# Patient Record
Sex: Female | Born: 1965 | Race: White | Hispanic: No | Marital: Married | Smoking: Never smoker
Health system: Southern US, Community
[De-identification: ages and names within clinical notes are randomized; demographics above are authoritative.]

## PROBLEM LIST (undated history)

## (undated) DIAGNOSIS — J309 Allergic rhinitis, unspecified: Secondary | ICD-10-CM

## (undated) DIAGNOSIS — E559 Vitamin D deficiency, unspecified: Secondary | ICD-10-CM

## (undated) DIAGNOSIS — M8580 Other specified disorders of bone density and structure, unspecified site: Secondary | ICD-10-CM

## (undated) HISTORY — DX: Vitamin D deficiency, unspecified: E55.9

## (undated) HISTORY — DX: Other specified disorders of bone density and structure, unspecified site: M85.80

## (undated) HISTORY — DX: Allergic rhinitis, unspecified: J30.9

---

## 2015-03-17 ENCOUNTER — Encounter: Payer: Self-pay | Admitting: Sports Medicine

## 2015-03-17 ENCOUNTER — Ambulatory Visit (INDEPENDENT_AMBULATORY_CARE_PROVIDER_SITE_OTHER): Payer: BLUE CROSS/BLUE SHIELD | Admitting: Sports Medicine

## 2015-03-17 ENCOUNTER — Ambulatory Visit (INDEPENDENT_AMBULATORY_CARE_PROVIDER_SITE_OTHER): Payer: BLUE CROSS/BLUE SHIELD

## 2015-03-17 VITALS — BP 108/70 | HR 90 | Ht 65.0 in | Wt 189.0 lb

## 2015-03-17 DIAGNOSIS — Z1389 Encounter for screening for other disorder: Secondary | ICD-10-CM

## 2015-03-17 DIAGNOSIS — M5412 Radiculopathy, cervical region: Secondary | ICD-10-CM

## 2015-03-17 DIAGNOSIS — M8580 Other specified disorders of bone density and structure, unspecified site: Secondary | ICD-10-CM

## 2015-03-17 DIAGNOSIS — M1712 Unilateral primary osteoarthritis, left knee: Secondary | ICD-10-CM | POA: Insufficient documentation

## 2015-03-17 DIAGNOSIS — M4802 Spinal stenosis, cervical region: Secondary | ICD-10-CM | POA: Diagnosis not present

## 2015-03-17 DIAGNOSIS — M8588 Other specified disorders of bone density and structure, other site: Secondary | ICD-10-CM

## 2015-03-17 DIAGNOSIS — M85862 Other specified disorders of bone density and structure, left lower leg: Secondary | ICD-10-CM

## 2015-03-17 DIAGNOSIS — M25562 Pain in left knee: Secondary | ICD-10-CM

## 2015-03-17 HISTORY — DX: Other specified disorders of bone density and structure, unspecified site: M85.80

## 2015-03-17 MED ORDER — MELOXICAM 15 MG PO TABS: ORAL_TABLET | ORAL | Status: DC

## 2015-03-17 NOTE — Assessment & Plan Note (Signed)
I am going to go ahead and order a bone density test.

## 2015-03-17 NOTE — Progress Notes (Signed)
   Subjective:    I'm seeing this patient as a consultation for:  Ms. Jomarie LongsJoseph, NP  CC:  Left knee pain  HPI: For several weeks this pleasant 49 year old female has had increasing pain that she localizes just distal to the medial joint line without mechanical symptoms, only minimal swelling. Mild joint line pain. Symptoms are moderate, persistent.   She also has been seeing a chiropractor for neck pain with radiation to the right hand in a C8 distribution. Symptoms are moderate, persistent. Has only had a moderate response thus far with chiropractic care.  Past medical history, Surgical history, Family history not pertinant except as noted below, Social history, Allergies, and medications have been entered into the medical record, reviewed, and no changes needed.   Review of Systems: No headache, visual changes, nausea, vomiting, diarrhea, constipation, dizziness, abdominal pain, skin rash, fevers, chills, night sweats, weight loss, swollen lymph nodes, body aches, joint swelling, muscle aches, chest pain, shortness of breath, mood changes, visual or auditory hallucinations.   Objective:   General: Well Developed, well nourished, and in no acute distress.  Neuro/Psych: Alert and oriented x3, extra-ocular muscles intact, able to move all 4 extremities, sensation grossly intact. Skin: Warm and dry, no rashes noted.  Respiratory: Not using accessory muscles, speaking in full sentences, trachea midline.  Cardiovascular: Pulses palpable, no extremity edema. Abdomen: Does not appear distended. Left Knee: Minimally tender to palpation at the medial joint line and over the pes anserine bursa. ROM normal in flexion and extension and lower leg rotation. Ligaments with solid consistent endpoints including ACL, PCL, LCL, MCL. Negative Mcmurray's and provocative meniscal tests. Non painful patellar compression. Patellar and quadriceps tendons unremarkable. Hamstring and quadriceps strength is  normal.  X-ray show multilevel degenerative change.   Knee x-rays do show arthritis in all 3 compartments.  Impression and Recommendations:   This case required medical decision making of moderate complexity.

## 2015-03-17 NOTE — Assessment & Plan Note (Signed)
Right C8, has gone through months of chiropractic treatment. We are going to get some x-rays but I will not actively treat this until she is plateaued with chiropractic care.

## 2015-03-17 NOTE — Assessment & Plan Note (Signed)
Most likely early osteoarthritis versus pes anserine bursitis. Formal physical therapy, meloxicam, x-rays.  Return in one month, further interventional treatment will depend on imaging results.

## 2015-03-18 ENCOUNTER — Encounter: Payer: Self-pay | Admitting: Physical Therapy

## 2015-03-18 ENCOUNTER — Ambulatory Visit (INDEPENDENT_AMBULATORY_CARE_PROVIDER_SITE_OTHER): Payer: BLUE CROSS/BLUE SHIELD | Admitting: Physical Therapy

## 2015-03-18 DIAGNOSIS — M6281 Muscle weakness (generalized): Secondary | ICD-10-CM

## 2015-03-18 DIAGNOSIS — M25562 Pain in left knee: Secondary | ICD-10-CM

## 2015-03-18 NOTE — Patient Instructions (Signed)
Straight Leg Raise: With External Leg Rotation   Lie on back with right leg straight, opposite leg bent. Rotate straight leg out and lift __6-8__ inches. Repeat __15__ times per set. Do __3__ sets per session. Do _1__ sessions per day. Repeat on both legs  http://orth.exer.us/728   Strengthening: Hip Abduction (Side-Lying)   Tighten muscles on front of left thigh, then lift leg _8-10___ inches from surface, keeping knee locked.  Repeat _15_ times per set. Do __3__ sets per session. Do __1__ sessions per day. Repeat on both legs  http://orth.exer.us/622   Copyright  VHI. All rights reserved.  Quads / HF, Prone   Lie face down, knees together. Grasp left ankle with a towel if needed to reach. Gently pull foot toward buttock. Hold 30-45 seconds. Repeat _2__ times per session. Do __1_ sessions per day. [place] [phone]

## 2015-03-18 NOTE — Therapy (Signed)
[facility] [place] [address] [place], [place], [address] Phone: [phone]   Fax:  [phone]  Physical Therapy Treatment  Patient Details  Name: Heidi Fuller MRN: [id number] Date of Birth: 08-Jun-1966 Referring Provider:  Monica Becton,*  Encounter Date: 03/18/2015      PT End of Session - 03/18/15 1152    Visit Number 1   Number of Visits 4   Date for PT Re-Evaluation 05/13/15   PT Start Time 1152   PT Stop Time 1246   PT Time Calculation (min) 54 min   Activity Tolerance Patient tolerated treatment well      History reviewed. No pertinent past medical history.  History reviewed. No pertinent past surgical history.  There were no vitals filed for this visit.  Visit Diagnosis:  Pain in knee joint, left - Plan: PT plan of care cert/re-cert  Muscle weakness (generalized) - Plan: PT plan of care cert/re-cert      Subjective Assessment - 03/18/15 1159    Symptoms Patient reports she tried to start working out on a step and thinks she over did it and developed Lt knee pain .    Pertinent History going to have a bone scan on her neck    Diagnostic tests x-rays - degenerative changes   Patient Stated Goals wishes to start exercising,    Currently in Pain? Yes   Pain Score 2    Pain Location Knee   Pain Orientation Left   Pain Descriptors / Indicators Sharp;Aching;Sore   Pain Type Acute pain   Pain Frequency Intermittent   Aggravating Factors  intermittent, has been favoring her knee so she is not sure.  some days it doesn't hurt too bad.Will shoot up to 6/10   Pain Relieving Factors wearing a compression sleeve most days. Gustavus Bryant            [facility] PT Assessment - 03/18/15 0001    Assessment   Medical Diagnosis Lt knee pain   Onset Date 02/11/15   Next MD Visit 04/14/15   Precautions   Precautions None   Balance Screen   Has the patient fallen in the past 6 months Yes   How many times? 1  fell down  the stairs, not used to stairs at her new house   Has the patient had a decrease in activity level because of a fear of falling?  No   Is the patient reluctant to leave their home because of a fear of falling?  No   Prior Function   Level of Independence --  was I with all activities   Vocation Full time employment   Vocation Requirements desk job/stand up desk   Leisure likes to work out   Observation/Other Assessments   Focus on Therapeutic Outcomes (FOTO)  45% limited   Posture/Postural Control   Posture/Postural Control --  extra soft tissue around her knees.    ROM / Strength   AROM / PROM / Strength AROM;Strength   AROM   Overall AROM Comments LE's WNL however lateral tracking Lt patella   Strength   Overall Strength --  ankles/knees WNL   Overall Strength Comments --  fair eccentric Lt quad control, good on the Rt   Strength Assessment Site Hip   Right/Left Hip Right;Left   Right Hip Flexion 5/5   Right Hip Extension --  5-/5   Right Hip ABduction 4+/5   Left Hip Flexion 5/5   Left Hip Extension 4+/5  Left Hip ABduction 4/5   Flexibility   Soft Tissue Assessment /Muscle Length --  tight Lt quad prone    Balance   Balance Assessed --  single leg stance WNL bilat, however pain on Lt LE                   [facility] Adult PT Treatment/Exercise - 03/18/15 0001    Exercises   Exercises Knee/Hip   Knee/Hip Exercises: Stretches   Quad Stretch 30 seconds  prone with strap   Knee/Hip Exercises: Supine   Straight Leg Raise with External Rotation 3 sets;15 reps;Both   Knee/Hip Exercises: Sidelying   Hip ABduction Both;3 sets;15 reps                PT Education - 03/18/15 1230    Education provided Yes   Education Details HEP   Person(s) Educated Patient   Methods Explanation;Handout   Comprehension Verbalized understanding             PT Long Term Goals - 03/18/15 1233    PT LONG TERM GOAL #1   Title I with HEP   Time 8   Period Weeks    Status New   PT LONG TERM GOAL #2   Title demo increase strength bilat hip abduction and extension =/> 5-5   Time 8   Period Weeks   Status New   PT LONG TERM GOAL #3   Title improve FOTO =/< 35% limited   Time 8   Period Weeks   Status New   PT LONG TERM GOAL #4   Title demo good quad control with eccentric activities   Time 8   Period Weeks   Status New               Plan - 03/18/15 1251    Clinical Impression Statement Pt presents with Lt knee pain, has weakness in her hips that are causing instability through the knee with weight bearing activities.  She should repsond well to Presenter, broadcastingstrengthening and body mechanic education   Pt will benefit from skilled therapeutic intervention in order to improve on the following deficits Pain;Decreased strength   Rehab Potential Excellent   PT Frequency --  everyother week   PT Duration 8 weeks   PT Treatment/Interventions Moist Heat;Patient/family education;DME Instruction;Therapeutic exercise;Ultrasound;Balance training;Gait training;Manual techniques;Cryotherapy;Stair training;Neuromuscular re-education;Electrical Stimulation   PT Next Visit Plan progress HEP    Consulted and Agree with Plan of Care Patient        Problem List Patient Active Problem List   Diagnosis Date Noted  . Left knee pain 03/17/2015  . Right cervical radiculopathy 03/17/2015  . Osteopenia determined by x-ray 03/17/2015    Roderic ScarceSusan Nethaniel Mattie PT 03/18/2015, 12:57 PM  [facility] [address] [place], [place], [address] Phone: [phone]   Fax:  [phone]

## 2015-03-25 ENCOUNTER — Telehealth: Payer: Self-pay | Admitting: Sports Medicine

## 2015-03-25 NOTE — Telephone Encounter (Signed)
Are we talking about for her knee or her neck?  It into the knee, she should return and I will inject it, it it's the neck, she simply needs more physical therapy, and I am happy to add some gabapentin to block her pain.

## 2015-03-25 NOTE — Telephone Encounter (Signed)
Patient called clinic to see what Dr. Benjamin Stainhekkekandam would prefer she take OTC along with her Meloxicam? States the meloxicam helps for the first 2 days, and now she is not getting good pain relief.

## 2015-03-28 ENCOUNTER — Encounter: Payer: Self-pay | Admitting: Sports Medicine

## 2015-03-28 ENCOUNTER — Ambulatory Visit (INDEPENDENT_AMBULATORY_CARE_PROVIDER_SITE_OTHER): Payer: BLUE CROSS/BLUE SHIELD | Admitting: Sports Medicine

## 2015-03-28 VITALS — BP 138/85 | HR 94 | Wt 189.0 lb

## 2015-03-28 DIAGNOSIS — M1712 Unilateral primary osteoarthritis, left knee: Secondary | ICD-10-CM | POA: Diagnosis not present

## 2015-03-28 NOTE — Assessment & Plan Note (Signed)
Unable to tolerate oral medications or therapy at this point, injection as above. Return at the end of the month to reevaluate. She does have some mechanical symptoms, and will likely need an MRI and arthroscopy if symptoms persist.

## 2015-03-28 NOTE — Telephone Encounter (Signed)
Attempted to contact Pt regarding recommendations,no answer. Left message and provided callback.

## 2015-03-28 NOTE — Telephone Encounter (Signed)
Spoke with pt's husband regarding her knee pain and she is actually on the schedule for today.

## 2015-03-28 NOTE — Progress Notes (Signed)
  Subjective:    CC: follow-up  HPI: Left knee pain: Has done some exercises with physical therapy however continues to have pain that she localizes in the posterior aspect of the joint, unfortunately she was not able to make it until her next follow-up visit, and has a recurrence of pain. Moderate, persistent. Localized in the posterior joint line with radiation into the upper calf. She does have a few mechanical symptoms, predominantly buckling.  Past medical history, Surgical history, Family history not pertinant except as noted below, Social history, Allergies, and medications have been entered into the medical record, reviewed, and no changes needed.   Review of Systems: No fevers, chills, night sweats, weight loss, chest pain, or shortness of breath.   Objective:    General: Well Developed, well nourished, and in no acute distress.  Neuro: Alert and oriented x3, extra-ocular muscles intact, sensation grossly intact.  HEENT: Normocephalic, atraumatic, pupils equal round reactive to light, neck supple, no masses, no lymphadenopathy, thyroid nonpalpable.  Skin: Warm and dry, no rashes. Cardiac: Regular rate and rhythm, no murmurs rubs or gallops, no lower extremity edema.  Respiratory: Clear to auscultation bilaterally. Not using accessory muscles, speaking in full sentences. Left Knee: Normal to inspection with no erythema or effusion or obvious bony abnormalities. Palpation normal with no warmth or joint line tenderness or patellar tenderness or condyle tenderness. ROM normal in flexion and extension and lower leg rotation. Ligaments with solid consistent endpoints including ACL, PCL, LCL, MCL. Negative Mcmurray's and provocative meniscal tests. Non painful patellar compression. Patellar and quadriceps tendons unremarkable. Hamstring and quadriceps strength is normal.  Procedure: Real-time Ultrasound Guided Injection of left knee Device: GE Logiq E  Verbal informed consent  obtained.  Time-out conducted.  Noted no overlying erythema, induration, or other signs of local infection.  Skin prepped in a sterile fashion.  Local anesthesia: Topical Ethyl chloride.  With sterile technique and under real time ultrasound guidance:  2 mL kenalog 40, 4 mL lidocaine injected easily. Completed without difficulty  Pain immediately resolved suggesting accurate placement of the medication.  Advised to call if fevers/chills, erythema, induration, drainage, or persistent bleeding.  Images permanently stored and available for review in the ultrasound unit.  Impression: Technically successful ultrasound guided injection.  Impression and Recommendations:

## 2015-03-30 ENCOUNTER — Ambulatory Visit (INDEPENDENT_AMBULATORY_CARE_PROVIDER_SITE_OTHER): Payer: BLUE CROSS/BLUE SHIELD

## 2015-03-30 ENCOUNTER — Other Ambulatory Visit: Payer: BLUE CROSS/BLUE SHIELD

## 2015-03-30 ENCOUNTER — Ambulatory Visit (INDEPENDENT_AMBULATORY_CARE_PROVIDER_SITE_OTHER): Payer: BLUE CROSS/BLUE SHIELD | Admitting: Physical Therapy

## 2015-03-30 DIAGNOSIS — Z13820 Encounter for screening for osteoporosis: Secondary | ICD-10-CM | POA: Diagnosis not present

## 2015-03-30 DIAGNOSIS — M25562 Pain in left knee: Secondary | ICD-10-CM

## 2015-03-30 DIAGNOSIS — M6281 Muscle weakness (generalized): Secondary | ICD-10-CM | POA: Diagnosis not present

## 2015-03-30 NOTE — Therapy (Signed)
[facility] [place] [address] [place], [place], [address] Phone: [phone]   Fax:  [phone]  Physical Therapy Treatment  Patient Details  Name: Heidi Fuller MRN: [id number] Date of Birth: Mar 07, 1966 Referring Provider:  Monica Becton,*  Encounter Date: 03/30/2015      PT End of Session - 03/30/15 0800    Visit Number 2   Number of Visits 4   Date for PT Re-Evaluation 05/13/15   PT Start Time 0801   PT Stop Time 0849   PT Time Calculation (min) 48 min   Activity Tolerance Patient tolerated treatment well   Behavior During Therapy [facility] for tasks assessed/performed      No past medical history on file.  No past surgical history on file.  There were no vitals filed for this visit.  Visit Diagnosis:  Pain in knee joint, left  Muscle weakness (generalized)      Subjective Assessment - 03/30/15 0802    Subjective Last wed left knee got really bad. Sunday something popped in knee. Dr T. gave her an injection Monday  to lateral knee and feels a little better. Couldn't straigten leg but now can since injection.   Limitations Standing   Currently in Pain? Yes   Pain Score 4    Pain Location Knee   Pain Orientation Left   Pain Descriptors / Indicators Aching;Sharp;Sore   Pain Type Acute pain   Pain Onset 1 to 4 weeks ago   Pain Frequency Intermittent   Aggravating Factors  unsure   Pain Relieving Factors heat   Effect of Pain on Daily Activities unable to walk normally            [facility] PT Assessment - 03/30/15 0001    Strength   Right Hip Extension --  5-/5   Right Hip ABduction 4+/5   Left Hip Extension 4+/5   Left Hip ABduction 4+/5                   [facility] Adult PT Treatment/Exercise - 03/30/15 0001    Knee/Hip Exercises: Stretches   Passive Hamstring Stretch 3 reps;30 seconds  with strap   ITB Stretch 3 reps;30 seconds  with straps   Gastroc Stretch 1 rep;30 seconds   Soleus  Stretch 1 rep;30 seconds   Knee/Hip Exercises: Standing   Lateral Step Up Limitations 8inch x 20   Forward Step Up Limitations 8inch x 20   Other Standing Knee Exercises calf raises 3 way x 10 each   Knee/Hip Exercises: Sidelying   Clams 1x10, then red tbanc 2x10   Manual Therapy   Manual Therapy Myofascial release   Myofascial Release Left ITB in sidelying and hamstrings and gastroc in prone                PT Education - 03/30/15 0858    Education provided Yes   Education Details HEP   Person(s) Educated Patient   Methods Explanation;Demonstration;Handout   Comprehension Verbalized understanding;Returned demonstration             PT Long Term Goals - 03/30/15 0903    PT LONG TERM GOAL #1   Title I with HEP   Time 8   Period Weeks   Status On-going   PT LONG TERM GOAL #2   Title demo increase strength bilat hip abduction and extension =/> 5-5   Time 8   Period Weeks   Status On-going   PT LONG TERM GOAL #3  Title improve FOTO =/< 35% limited   Time 8   Period Weeks   Status On-going   PT LONG TERM GOAL #4   Title demo good quad control with eccentric activities   Time 8   Period Weeks   Status On-going               Plan - 03/30/15 [id number]    Clinical Impression Statement Patient tolerated treatment well after flare up this past weekend and subsequent lidocaine injection. She is still favoring the left knee but able to straighten knee and fully WB. Pain is localized to posterior knee and distal ITB. Patient demos tightness in left soleus also. Patient is progressing with hip strengthening.   Pt will benefit from skilled therapeutic intervention in order to improve on the following deficits Pain;Decreased strength   Rehab Potential Excellent   PT Frequency Other (comment)  1 every 2wks   PT Treatment/Interventions Moist Heat;Patient/family education;DME Instruction;Therapeutic exercise;Ultrasound;Balance training;Gait training;Manual  techniques;Cryotherapy;Stair training;Neuromuscular re-education;Electrical Stimulation   PT Next Visit Plan progress HEP    Consulted and Agree with Plan of Care Patient        Problem List Patient Active Problem List   Diagnosis Date Noted  . Primary osteoarthritis of left knee 03/17/2015  . Right cervical radiculopathy 03/17/2015  . Osteopenia determined by x-ray 03/17/2015    Solon PalmJulie Marilla Boddy PT  03/30/2015, 9:05 AM  [facility] [address] [address] [place], [place], [address] Phone: [phone]   Fax:  [phone]

## 2015-03-30 NOTE — Patient Instructions (Signed)
.  Achilles / Soleus, Standing   Stand, right foot behind, heel on floor and turned slightly out. Lower hips and bend knees. Hold __30_ seconds. Repeat _3__ times per session. Do 2___ sessions per day.  Copyright  VHI. All rights reserved.   Achilles / Gastroc, Standing   Stand, right foot behind, heel on floor and turned slightly out, leg straight, forward leg bent. Move hips forward. Hold _30__ seconds. Repeat _3__ times per session. Do 2_ sessions per day.  Copyright  VHI. All rights reserved.  External Rotation: Hip - Knees Apart (Side-Lying)   Lie on left side with hips and knees slightly bent, band tied just above knees. Pull knees apart. Hold for __2_ seconds. Rest for ___ seconds. Repeat _10-30__ times. Do ___ times a day.   Copyright  VHI. All rights reserved.   Adductor Stretch: Reclined (Strap, Wall)   Warm up with leg vertical. Rotate leg to side and fix foot to wall. Anchor opposite hip. Hold for 30 seconds. Repeat 3__ times each leg.  Also, pull left leg across body to the right and hold 30 sec x 3.   Copyright  VHI. All rights reserved.    Solon PalmJulie Kaydyn Sayas, PT 03/30/2015 8:49 AM [facility] [phone]

## 2015-04-12 ENCOUNTER — Encounter: Payer: Self-pay | Admitting: Sports Medicine

## 2015-04-14 ENCOUNTER — Encounter: Payer: Self-pay | Admitting: Sports Medicine

## 2015-04-14 ENCOUNTER — Ambulatory Visit (INDEPENDENT_AMBULATORY_CARE_PROVIDER_SITE_OTHER): Payer: BLUE CROSS/BLUE SHIELD | Admitting: Sports Medicine

## 2015-04-14 ENCOUNTER — Ambulatory Visit (INDEPENDENT_AMBULATORY_CARE_PROVIDER_SITE_OTHER): Payer: BLUE CROSS/BLUE SHIELD | Admitting: Family Medicine

## 2015-04-14 ENCOUNTER — Encounter: Payer: Self-pay | Admitting: Family Medicine

## 2015-04-14 ENCOUNTER — Ambulatory Visit (INDEPENDENT_AMBULATORY_CARE_PROVIDER_SITE_OTHER): Payer: BLUE CROSS/BLUE SHIELD | Admitting: Physical Therapy

## 2015-04-14 VITALS — BP 107/73 | HR 84 | Ht 65.0 in | Wt 192.0 lb

## 2015-04-14 DIAGNOSIS — J302 Other seasonal allergic rhinitis: Secondary | ICD-10-CM

## 2015-04-14 DIAGNOSIS — E559 Vitamin D deficiency, unspecified: Secondary | ICD-10-CM

## 2015-04-14 DIAGNOSIS — M25562 Pain in left knee: Secondary | ICD-10-CM

## 2015-04-14 DIAGNOSIS — M1712 Unilateral primary osteoarthritis, left knee: Secondary | ICD-10-CM

## 2015-04-14 DIAGNOSIS — Z0000 Encounter for general adult medical examination without abnormal findings: Secondary | ICD-10-CM

## 2015-04-14 NOTE — Progress Notes (Addendum)
Subjective:    Patient ID: Heidi Fuller, female    DOB: 06/12/1966, 49 y.o.   MRN: [id number]  HPI She is her to estab care.      She plans on scheduling a CPE.  Soon. She would like to get her labs slip.    He has certain labs that she would like checked again. She has hx of vitamin D def. She has been taking a supplement for this OTC. She wants to know difference b/t D2 and D3.    Her prior PCP has been following her CRP intermittantly to help eval her cardiac risk. She would like ot have that done again.   AR- she uses allegra and flonase for this.    She is also being seen by Dr. Rodney Langton for [facility] arthritis of the left knee. In fact she is actually starting physical therapy today.  Review of Systems  Constitutional: Negative for fever, diaphoresis and unexpected weight change.  HENT: Negative for hearing loss, rhinorrhea and tinnitus.   Eyes: Negative for visual disturbance.  Respiratory: Negative for cough and wheezing.   Cardiovascular: Negative for chest pain and palpitations.  Gastrointestinal: Negative for nausea, vomiting, diarrhea and blood in stool.  Genitourinary: Negative for vaginal bleeding, vaginal discharge and difficulty urinating.  Musculoskeletal: Negative for myalgias and arthralgias.  Skin: Negative for rash.  Neurological: Negative for headaches.  Hematological: Negative for adenopathy. Does not bruise/bleed easily.  Psychiatric/Behavioral: Negative for sleep disturbance and dysphoric mood. The patient is not nervous/anxious.    BP 107/73 mmHg  Pulse 84  Ht  (1.651 m)  Wt 192 lb (87.091 kg)  BMI 31.95 kg/m2  LMP  (LMP Unknown)    No Known Allergies  History reviewed. No pertinent past medical history.  History reviewed. No pertinent past surgical history.  History   Social History  . Marital Status: Married    Spouse Name: Nysia Dell  . Number of Children: 5  . Years of Education: N/A   Occupational History  . customer  service     [employer]   Social History Main Topics  . Smoking status: Never Smoker   . Smokeless tobacco: Never Used  . Alcohol Use: 3.6 - 4.2 oz/week    6-7 Glasses of wine per week     Comment: 6-7/week  . Drug Use: No  . Sexual Activity:    Partners: Male    Birth Control/ Protection: None   Other Topics Concern  . Not on file   Social History Narrative   1-2 cups of caffeine per day, she works for [employer] as a Museum/gallery conservator. She does not exercise regularly. She completed Technical school.     Family History  Problem Relation Age of Onset  . Heart attack Father   . Hyperlipidemia Father   . Hypertension Father     Outpatient Encounter Prescriptions as of 04/14/2015  Medication Sig  . Cholecalciferol (D3 ADULT PO) Take by mouth.  Marland Kitchen CINNAMON PO Take by mouth.  . fexofenadine (ALLEGRA) 30 MG tablet Take 30 mg by mouth 2 (two) times daily as needed.  . fluticasone (FLONASE) 50 MCG/ACT nasal spray Place into both nostrils daily.  . meloxicam (MOBIC) 15 MG tablet One tab PO qAM with breakfast for 2 weeks, then daily prn pain.  . Multiple Vitamin (MULTIVITAMIN) capsule Take 1 capsule by mouth daily.  . [DISCONTINUED] ibuprofen (ADVIL,MOTRIN) 100 MG tablet Take 200 mg by mouth every 6 (six) hours  as needed for pain (knee pain).   No facility-administered encounter medications on file as of 04/14/2015.          Objective:   Physical Exam  Constitutional: She is oriented to person, place, and time. She appears well-developed and well-nourished.  HENT:  Head: Normocephalic and atraumatic.  Cardiovascular: Normal rate, regular rhythm and normal heart sounds.   Pulmonary/Chest: Effort normal and breath sounds normal.  Neurological: She is alert and oriented to person, place, and time.  Skin: Skin is warm and dry.  Psychiatric: She has a normal mood and affect. Her behavior is normal.          Assessment & Plan:  AR- continue  current regimen.  It seems to be working well.  Vit D def- recheck levels. Lab slip provided today. Continue supplementation.  Knee Roxy Mannsster arthritis-starting physical therapy and following with sports medicine.  Encouraged her to schedule physicals we can get up-to-date blood work seen.

## 2015-04-14 NOTE — Therapy (Signed)
[facility] [address] [place], [place], [address] Phone: [phone]   Fax:  [phone]  Physical Therapy Treatment  Patient Details  Name: Heidi Fuller MRN: [id number] Date of Birth: 01/24/1966 Referring Provider:  Monica Bectonhekkekandam, Thomas J,*  Encounter Date: 04/14/2015    No past medical history on file.  No past surgical history on file.  There were no vitals filed for this visit.  Visit Diagnosis:  Pain in knee joint, left      Subjective Assessment - 04/14/15 1637    Subjective Pt just came from MD office and he drew fluid off her knee, gave her an injection and is going to schedule an MRI as he thinks she may have a meniscus tear.                                          Plan - 04/14/15 1637    Clinical Impression Statement Pt making little progress with pain, knee motion and gait.  She has a 30 visit limit with her insurance.  We will place her on hold until after the MRI so if she has to have surgery we will save visits for then.    PT Next Visit Plan place on hold until MRI results   Consulted and Agree with Plan of Care Patient        Problem List Patient Active Problem List   Diagnosis Date Noted  . Primary osteoarthritis of left knee 03/17/2015  . Right cervical radiculopathy 03/17/2015  . Osteopenia determined by x-ray 03/17/2015    Roderic ScarceSusan Evalee Gerard, PT 04/14/2015, 4:39 PM  [facility] [address] [place], [place], [address] Phone: [phone]   Fax:  [phone]

## 2015-04-14 NOTE — Progress Notes (Signed)
  Subjective:    CC: Follow-up  HPI: Heidi Fuller returns with regards to her left knee swelling, she does have osteoarthritis, we aspirated it and injected it a month ago but at the time I suspected that we would probably need an MRI and referral for arthroscopy considering mechanical symptoms. She did have a recollection of the effusion, recurrence of pain and mechanical symptoms at the medial joint line and is back for further evaluation and treatment. Symptoms are moderate, worsening. No fevers, chills or other constitutional symptoms.  Past medical history, Surgical history, Family history not pertinant except as noted below, Social history, Allergies, and medications have been entered into the medical record, reviewed, and no changes needed.   Review of Systems: No fevers, chills, night sweats, weight loss, chest pain, or shortness of breath.   Objective:    General: Well Developed, well nourished, and in no acute distress.  Neuro: Alert and oriented x3, extra-ocular muscles intact, sensation grossly intact.  HEENT: Normocephalic, atraumatic, pupils equal round reactive to light, neck supple, no masses, no lymphadenopathy, thyroid nonpalpable.  Skin: Warm and dry, no rashes. Cardiac: Regular rate and rhythm, no murmurs rubs or gallops, no lower extremity edema.  Respiratory: Clear to auscultation bilaterally. Not using accessory muscles, speaking in full sentences. Left Knee: Visible and palpable effusion with a fluid wave and tenderness along the medial joint line as well as pain with terminal flexion. ROM normal in flexion and extension and lower leg rotation. Ligaments with solid consistent endpoints including ACL, PCL, LCL, MCL. Negative Mcmurray's and provocative meniscal tests. Non painful patellar compression. Patellar and quadriceps tendons unremarkable. Hamstring and quadriceps strength is normal.  Procedure: Real-time Ultrasound Guided Injection of left knee Device: GE Logiq E    Verbal informed consent obtained.  Time-out conducted.  Noted no overlying erythema, induration, or other signs of local infection.  Skin prepped in a sterile fashion.  Local anesthesia: Topical Ethyl chloride.  With sterile technique and under real time ultrasound guidance:  20 mL straw-colored fluid aspirated, syringe switched and 2 mL kenalog 40, 4 mL lidocaine injected easily. Completed without difficulty  Pain immediately resolved suggesting accurate placement of the medication.  Advised to call if fevers/chills, erythema, induration, drainage, or persistent bleeding.  Images permanently stored and available for review in the ultrasound unit.  Impression: Technically successful ultrasound guided injection.  Impression and Recommendations:

## 2015-04-14 NOTE — Assessment & Plan Note (Signed)
Repeat collection of effusion.  Repeat aspiration and injection. I do suspect a meniscal tear, MRI, referral to orthopedic surgery.

## 2015-04-18 ENCOUNTER — Ambulatory Visit (INDEPENDENT_AMBULATORY_CARE_PROVIDER_SITE_OTHER): Payer: BLUE CROSS/BLUE SHIELD

## 2015-04-18 DIAGNOSIS — S83242A Other tear of medial meniscus, current injury, left knee, initial encounter: Secondary | ICD-10-CM

## 2015-04-18 DIAGNOSIS — X58XXXA Exposure to other specified factors, initial encounter: Secondary | ICD-10-CM | POA: Diagnosis not present

## 2015-04-18 DIAGNOSIS — M1712 Unilateral primary osteoarthritis, left knee: Secondary | ICD-10-CM

## 2015-04-22 ENCOUNTER — Telehealth: Payer: Self-pay | Admitting: Family Medicine

## 2015-04-22 NOTE — Telephone Encounter (Signed)
Meniscal tear and chondromalacia as expected. She can either return to go over the results in more detail her we can simply await her referral to orthopedic surgery for arthroscopy.

## 2015-04-22 NOTE — Telephone Encounter (Signed)
Pt called.  She wants to know result of her MRI/left vm yesterday morning.  Her direct line is, [phone] and Cell # is, [phone].  Thank you

## 2015-04-22 NOTE — Telephone Encounter (Signed)
Dr. Karie Schwalbe please see note below. Patient was told that she needed a follow up appt for MRI results. Rhonda Cunningham,CMA

## 2015-04-25 ENCOUNTER — Telehealth: Payer: Self-pay | Admitting: Sports Medicine

## 2015-04-25 ENCOUNTER — Other Ambulatory Visit: Payer: BLUE CROSS/BLUE SHIELD

## 2015-04-25 DIAGNOSIS — J309 Allergic rhinitis, unspecified: Secondary | ICD-10-CM | POA: Insufficient documentation

## 2015-04-25 DIAGNOSIS — E559 Vitamin D deficiency, unspecified: Secondary | ICD-10-CM | POA: Insufficient documentation

## 2015-04-25 HISTORY — DX: Vitamin D deficiency, unspecified: E55.9

## 2015-04-25 HISTORY — DX: Allergic rhinitis, unspecified: J30.9

## 2015-04-25 MED ORDER — HYDROCODONE-ACETAMINOPHEN 5-325 MG PO TABS: 1.0000 | ORAL_TABLET | Freq: Three times a day (TID) | ORAL | Status: DC | PRN

## 2015-04-25 NOTE — Telephone Encounter (Signed)
Adding a prescription for hydrocodone for pain, has she already been set up with the orthopedist that she desires?

## 2015-04-25 NOTE — Telephone Encounter (Signed)
Dr. Karie Schwalbe, patient request to know if she still needs to come in for Mri results f/up if she is already scheduled for orthopedic surgeon. Patient also stated that she has taken Meloxicam but it is not helping with her pain at all and the pain comes and goes but when it comes it is extreme so would like to know if she can have something called in until she is seen by surgeon. Cell [phone]. Thanks

## 2015-04-25 NOTE — Telephone Encounter (Signed)
Patient's husband called advised that they have been calling and left vm since 04/21/15 and 04/22/15 and have not received a phone call back yet. Husband stated that wife is having extreme pain with knee and has been sobbing due to the pain. Would like to know if there is a stronger medicine that could be called in until she is able to come in for her mri result appointment. Patient would like to discuss going to a different Orthopedic surgeon and not the one a referral was sent for. Patient is scheduled for 04/29/15 i have called her and lvm to see if she can come in sooner but in the mean time is requesting to have something stronger called in for pain-vew

## 2015-04-25 NOTE — Telephone Encounter (Signed)
Dr. Karie Schwalbe please see notes below:  Called patient gave her MRI results as noted on previous phone note and also gave patient phone number for Dr. Rogelia BogaMartini. Patient has requested a pain medication stronger than Meloxicam and she stated that she would follow back up after she sees the orthopaedic. Dreden Rivere,CMA

## 2015-04-25 NOTE — Telephone Encounter (Signed)
Patient advised that rx is ready for pickup. Subrena Devereux,CMA

## 2015-04-28 ENCOUNTER — Encounter: Payer: BLUE CROSS/BLUE SHIELD | Admitting: Physical Therapy

## 2015-04-29 ENCOUNTER — Ambulatory Visit: Payer: BLUE CROSS/BLUE SHIELD | Admitting: Sports Medicine

## 2015-05-13 ENCOUNTER — Ambulatory Visit: Payer: BLUE CROSS/BLUE SHIELD | Admitting: Sports Medicine

## 2015-05-13 ENCOUNTER — Encounter: Payer: BLUE CROSS/BLUE SHIELD | Admitting: Family Medicine

## 2015-06-13 HISTORY — PX: KNEE ARTHROSCOPY: SHX127

## 2015-09-09 ENCOUNTER — Encounter: Payer: Self-pay | Admitting: Family Medicine

## 2015-10-22 LAB — HM MAMMOGRAPHY

## 2015-10-26 ENCOUNTER — Telehealth: Payer: Self-pay | Admitting: Family Medicine

## 2015-10-26 DIAGNOSIS — E559 Vitamin D deficiency, unspecified: Secondary | ICD-10-CM

## 2015-10-26 DIAGNOSIS — Z0000 Encounter for general adult medical examination without abnormal findings: Secondary | ICD-10-CM

## 2015-10-26 DIAGNOSIS — Z114 Encounter for screening for human immunodeficiency virus [HIV]: Secondary | ICD-10-CM

## 2015-10-26 NOTE — Telephone Encounter (Signed)
Patient called and scheduled appt for 11/08/15 and request to get a lab order for her cpe sent to [facility] on [address] [place], [place] [address] [phone]) [phone]. Thanks

## 2015-10-27 NOTE — Telephone Encounter (Signed)
Orders placed and faxed to: [phone].Loralee PacasBarkley, Lauran Romanski [place]

## 2015-11-02 ENCOUNTER — Encounter: Payer: Self-pay | Admitting: Family Medicine

## 2015-11-08 ENCOUNTER — Ambulatory Visit (INDEPENDENT_AMBULATORY_CARE_PROVIDER_SITE_OTHER): Payer: BLUE CROSS/BLUE SHIELD | Admitting: Family Medicine

## 2015-11-08 ENCOUNTER — Telehealth: Payer: Self-pay | Admitting: Family Medicine

## 2015-11-08 ENCOUNTER — Other Ambulatory Visit (HOSPITAL_COMMUNITY)
Admission: RE | Admit: 2015-11-08 | Discharge: 2015-11-08 | Disposition: A | Discharge status: Home / Self Care | Payer: BLUE CROSS/BLUE SHIELD | Source: Ambulatory Visit | Attending: Family Medicine | Admitting: Family Medicine

## 2015-11-08 ENCOUNTER — Encounter: Payer: Self-pay | Admitting: Family Medicine

## 2015-11-08 VITALS — BP 123/85 | HR 76 | Temp 98.7°F | Resp 18 | Wt 188.0 lb

## 2015-11-08 DIAGNOSIS — Z114 Encounter for screening for human immunodeficiency virus [HIV]: Secondary | ICD-10-CM

## 2015-11-08 DIAGNOSIS — Z124 Encounter for screening for malignant neoplasm of cervix: Secondary | ICD-10-CM | POA: Diagnosis not present

## 2015-11-08 DIAGNOSIS — Z01419 Encounter for gynecological examination (general) (routine) without abnormal findings: Secondary | ICD-10-CM | POA: Diagnosis present

## 2015-11-08 DIAGNOSIS — E559 Vitamin D deficiency, unspecified: Secondary | ICD-10-CM

## 2015-11-08 DIAGNOSIS — Z1151 Encounter for screening for human papillomavirus (HPV): Secondary | ICD-10-CM | POA: Insufficient documentation

## 2015-11-08 DIAGNOSIS — Z0189 Encounter for other specified special examinations: Secondary | ICD-10-CM

## 2015-11-08 DIAGNOSIS — Z13220 Encounter for screening for lipoid disorders: Secondary | ICD-10-CM | POA: Diagnosis not present

## 2015-11-08 DIAGNOSIS — Z0000 Encounter for general adult medical examination without abnormal findings: Secondary | ICD-10-CM

## 2015-11-08 NOTE — Telephone Encounter (Signed)
I looked through info. I couldn't find her old records or the release for records on file.  See if she can come back in to sign releae and we can fax to them for vaccine record, last pap , etc.

## 2015-11-08 NOTE — Progress Notes (Signed)
Subjective:     Heidi Fuller is a 49 y.o. female and is here for a comprehensive physical exam. The patient reports no problems.  Social History   Social History  . Marital Status: Married    Spouse Name: Serin Thornell  . Number of Children: 5  . Years of Education: N/A   Occupational History  . customer service     [employer]   Social History Main Topics  . Smoking status: Never Smoker   . Smokeless tobacco: Never Used  . Alcohol Use: 3.6 - 4.2 oz/week    6-7 Glasses of wine per week     Comment: 6-7/week  . Drug Use: No  . Sexual Activity:    Partners: Male    Birth Control/ Protection: None   Other Topics Concern  . Not on file   Social History Narrative   1-2 cups of caffeine per day, she works for [employer] as a Museum/gallery conservator. She does not exercise regularly. She completed Technical school.    Health Maintenance  Topic Date Due  . HIV Screening  08/22/1980  . TETANUS/TDAP  08/22/1984  . PAP SMEAR  08/22/1986  . INFLUENZA VACCINE  07/18/2015  . COLONOSCOPY  08/23/2015  . MAMMOGRAM  10/21/2017    The following portions of the patient's history were reviewed and updated as appropriate: allergies, current medications, past family history, past medical history, past social history, past surgical history and problem list.  Review of Systems Pertinent items noted in HPI and remainder of comprehensive ROS otherwise negative.   Objective:    BP 123/85 mmHg  Pulse 76  Temp(Src) 98.7 F (37.1 C)  Resp 18  Wt 188 lb (85.276 kg)  SpO2 100% General appearance: alert, cooperative and appears stated age Head: Normocephalic, without obvious abnormality, atraumatic Eyes: cnj clear, EOMi, PEERLA Ears: normal TM's and external ear canals both ears Nose: Nares normal. Septum midline. Mucosa normal. No drainage or sinus tenderness. Throat: lips, mucosa, and tongue normal; teeth and gums normal Neck: no adenopathy, no carotid bruit,  no JVD, supple, symmetrical, trachea midline and thyroid not enlarged, symmetric, no tenderness/mass/nodules Back: symmetric, no curvature. ROM normal. No CVA tenderness. Lungs: clear to auscultation bilaterally Breasts: normal appearance, no masses or tenderness Heart: regular rate and rhythm, S1, S2 normal, no murmur, click, rub or gallop Abdomen: soft, non-tender; bowel sounds normal; no masses,  no organomegaly Pelvic: cervix normal in appearance, external genitalia normal, no adnexal masses or tenderness, no cervical motion tenderness, rectovaginal septum normal, uterus normal size, shape, and consistency and vagina normal without discharge. Mild rectocele.  Extremities: extremities normal, atraumatic, no cyanosis or edema Pulses: 2+ and symmetric Skin: Skin color, texture, turgor normal. No rashes or lesions Lymph nodes: Cervical, supraclavicular, and axillary nodes normal. Neurologic: Alert and oriented X 3, normal strength and tone. Normal symmetric reflexes. Normal coordination and gait    Assessment:    Healthy female exam.      Plan:     See After Visit Summary for Counseling Recommendations  Keep up a regular exercise program and make sure you are eating a healthy diet Try to eat 4 servings of dairy a day, or if you are lactose intolerant take a calcium with vitamin D daily.    Discussed need for Tdap.  she really think she had the injection within the last 3 or 4 years. We'll try to get a copy from her old records.  Discussed need for colon cancer screening.  She says she will think about it. She has a lot going on right now so she said she may plan to do it next year. She also wants to find out where her mother goes that she's very happy with her GI doctor and see if she can maybe get scheduled there.   I don't have a copy of her old records. Will try to call and get those.

## 2015-11-08 NOTE — Patient Instructions (Signed)
Keep up a regular exercise program and make sure you are eating a healthy diet Try to eat 4 servings of dairy a day, or if you are lactose intolerant take a calcium with vitamin D daily.  Your vaccines are up to date.   

## 2015-11-09 LAB — COMPLETE METABOLIC PANEL WITH GFR
ALBUMIN: 4.4 g/dL (ref 3.6–5.1)
ALK PHOS: 73 U/L (ref 33–130)
ALT: 11 U/L (ref 6–29)
AST: 15 U/L (ref 10–35)
BILIRUBIN TOTAL: 0.7 mg/dL (ref 0.2–1.2)
BUN: 16 mg/dL (ref 7–25)
CO2: 27 mmol/L (ref 20–31)
Calcium: 9.2 mg/dL (ref 8.6–10.4)
Chloride: 102 mmol/L (ref 98–110)
Creat: 0.75 mg/dL (ref 0.50–1.05)
GLUCOSE: 95 mg/dL (ref 65–99)
POTASSIUM: 4 mmol/L (ref 3.5–5.3)
SODIUM: 139 mmol/L (ref 135–146)
Total Protein: 6.8 g/dL (ref 6.1–8.1)

## 2015-11-09 LAB — VITAMIN D 25 HYDROXY (VIT D DEFICIENCY, FRACTURES): Vit D, 25-Hydroxy: 40 ng/mL (ref 30–100)

## 2015-11-09 LAB — LIPID PANEL
Cholesterol: 190 mg/dL (ref 125–200)
HDL: 58 mg/dL
LDL Cholesterol: 118 mg/dL
Total CHOL/HDL Ratio: 3.3 ratio
Triglycerides: 72 mg/dL
VLDL: 14 mg/dL

## 2015-11-09 LAB — HIV ANTIBODY (ROUTINE TESTING W REFLEX): HIV 1&2 Ab, 4th Generation: NONREACTIVE

## 2015-11-09 LAB — CYTOLOGY - PAP

## 2015-11-13 NOTE — Progress Notes (Signed)
Quick Note:  Call patient: Your Pap smear is normal. Repeat in 5 years. ______ 

## 2015-11-16 NOTE — Telephone Encounter (Signed)
LMOVM

## 2016-11-22 IMAGING — CR DG CERVICAL SPINE COMPLETE 4+V
5 series · 5 of 5 positions shown · non-contrast
Comparison: None.

CLINICAL DATA: Neck pain for several years. No reported history of
injury. Initial evaluation.

EXAM:
CERVICAL SPINE  4+ VIEWS

[c-spine lat]
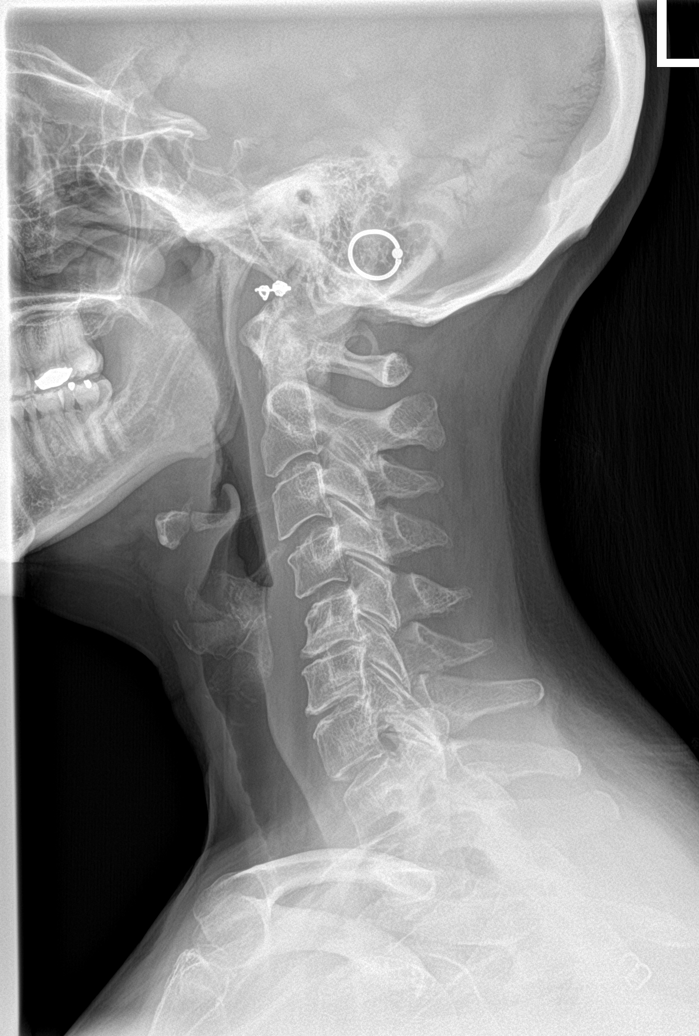

[c-spine obl (1 of 2)]
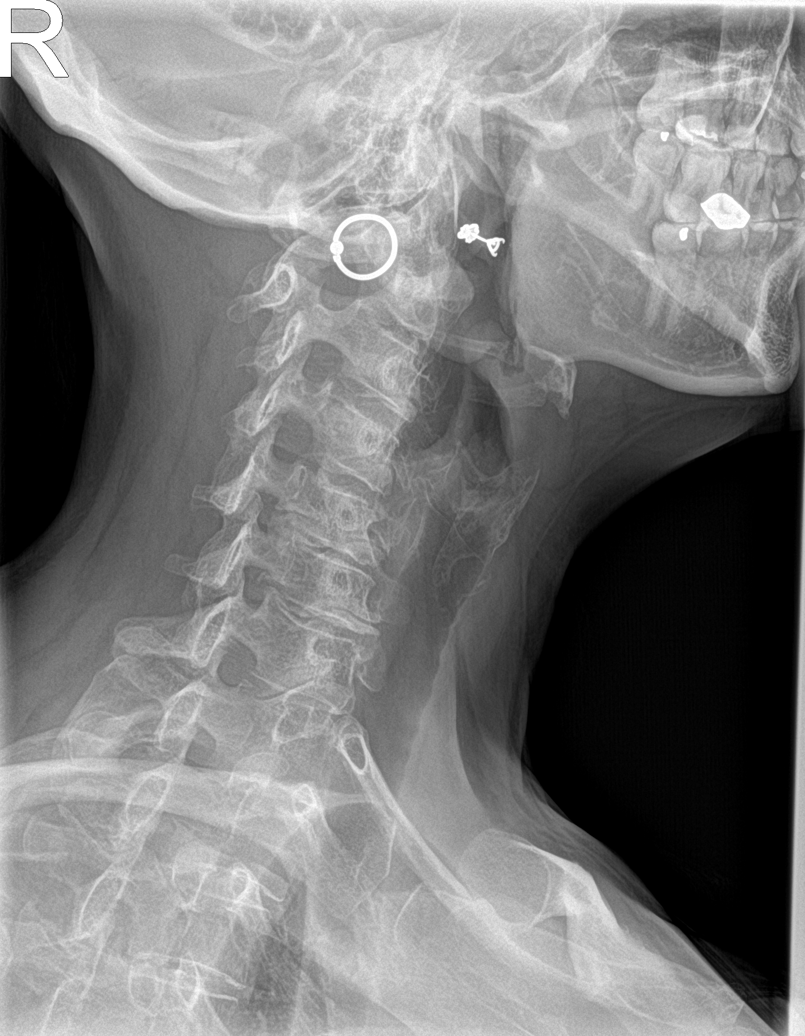

[c-spine obl (2 of 2)]
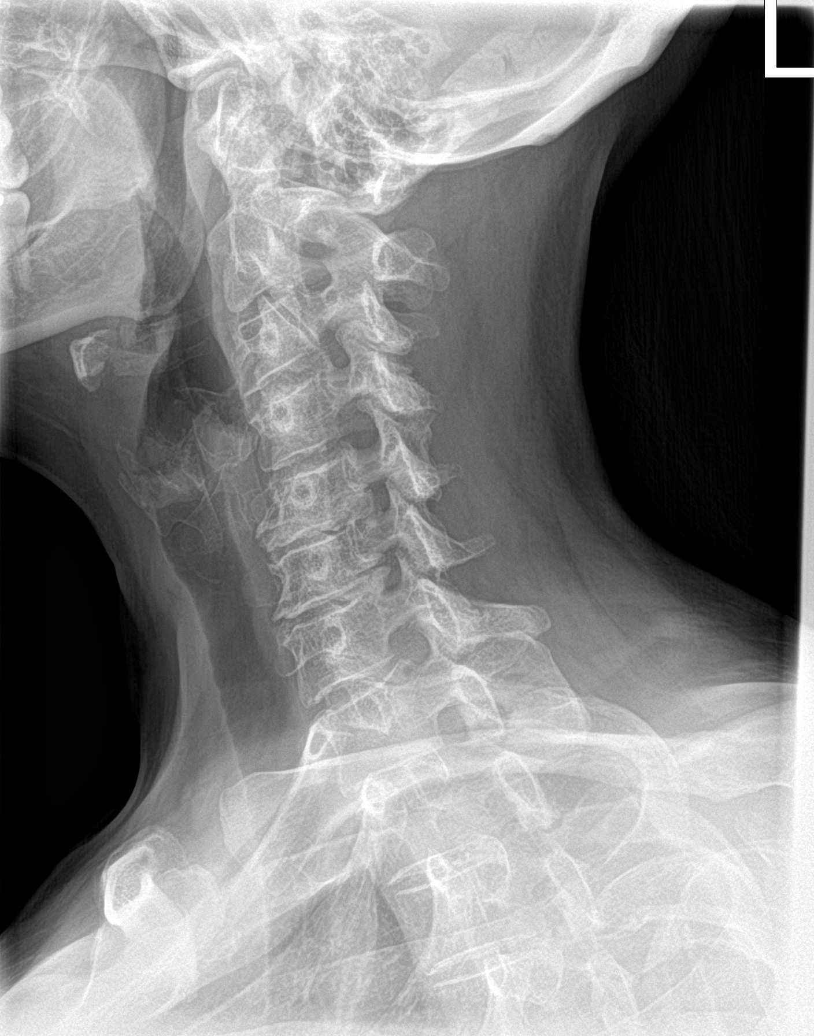

[c-spine ap]
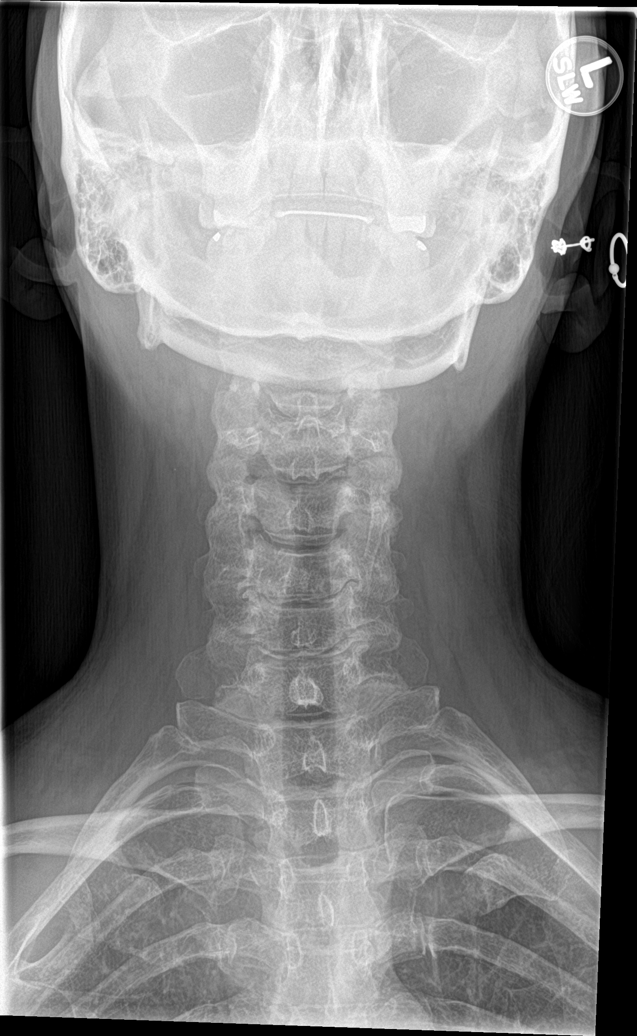

[c-spine open mouth]
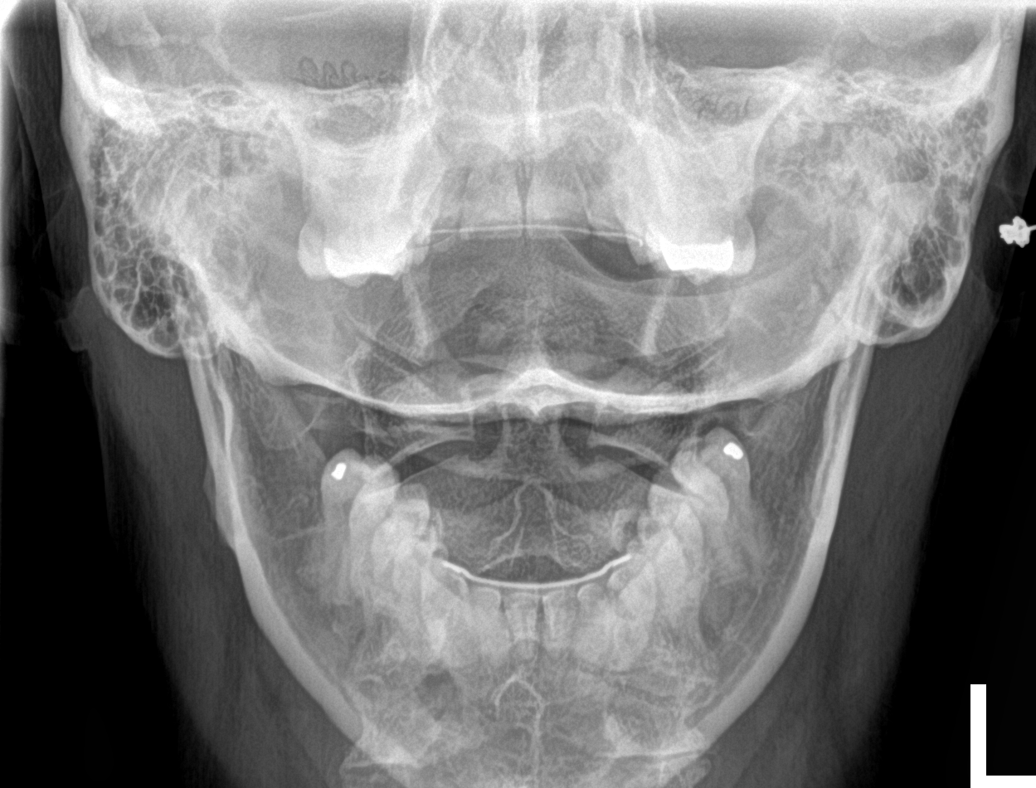

[5 of 5 positions shown; findings below may reference images not displayed]

FINDINGS: Soft tissue structures are unremarkable. Diffuse osteopenia. Diffuse
multilevel degenerative change cervical spine, degenerative changes
most severe at C5-C6, C6-C7. Prominent disc space loss and endplate
osteophytes noted at these levels. Mild straightening cervical spine
noted. This is most likely secondary to degenerative change.
Moderate bilateral multifocal neural foraminal narrowing present. No
evidence of fracture dislocation.
IMPRESSION: 1. Diffuse degenerative changes of the cervical spine. Degenerative
changes are particularly prominent at C5-C6 and C6-C7. Mild
straightening of the cervical spine is noted most likely secondary
DJD. Bilateral moderate multifocal neural foraminal narrowing.

2.  Diffuse osteopenia.

## 2016-11-22 IMAGING — CR DG KNEE COMPLETE 4+V*R*
5 series · 5 of 5 positions shown · non-contrast
Comparison: None.

CLINICAL DATA: Left knee pain. Initial evaluation. No known injury.

EXAM:
RIGHT KNEE - COMPLETE 4+ VIEW

[knee lat (1 of 2)]
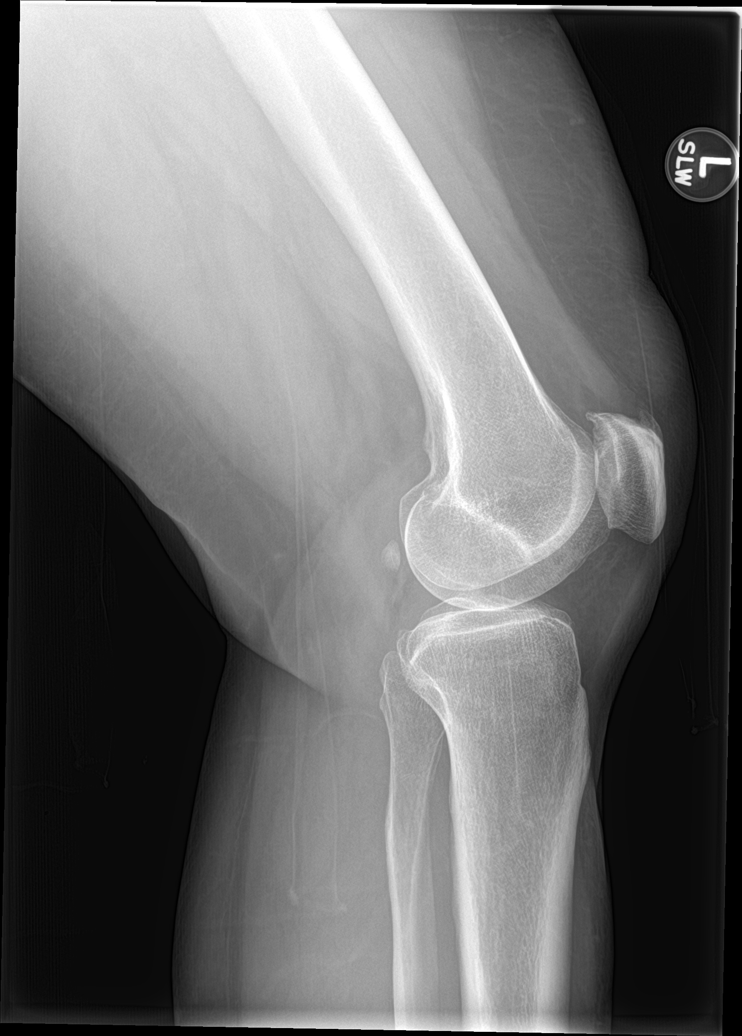

[knee ap bilat standing (1 of 2)]
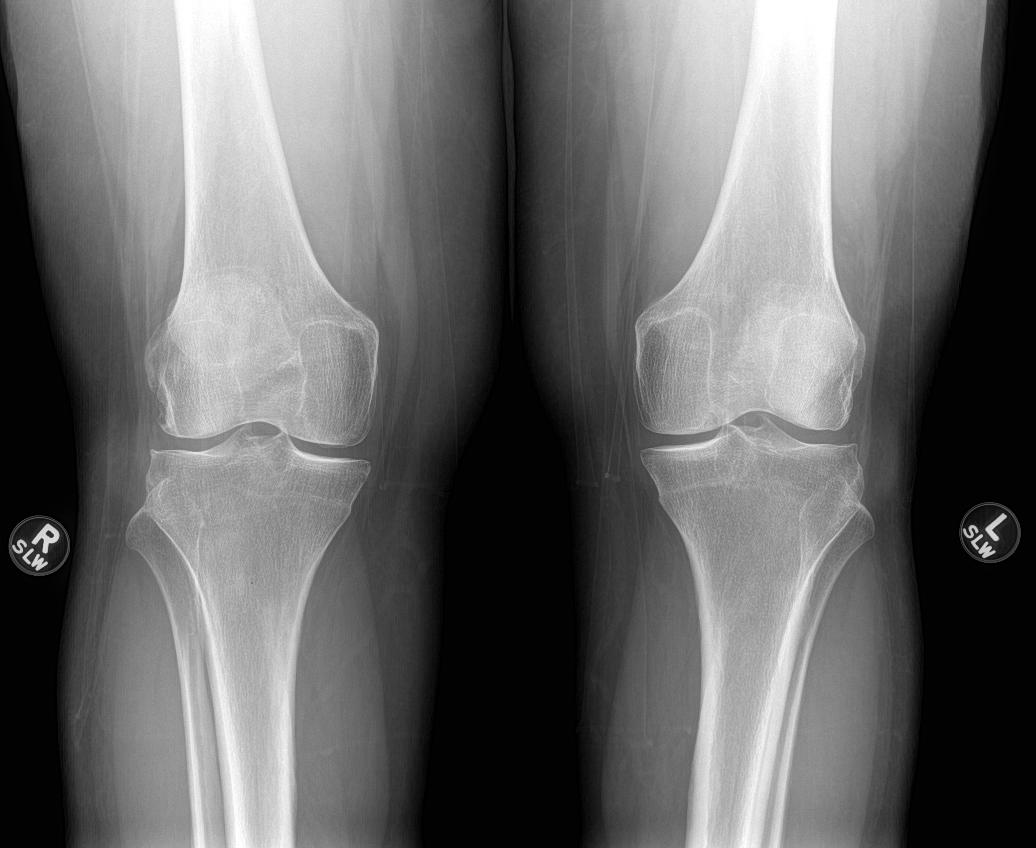

[knee sunrise]
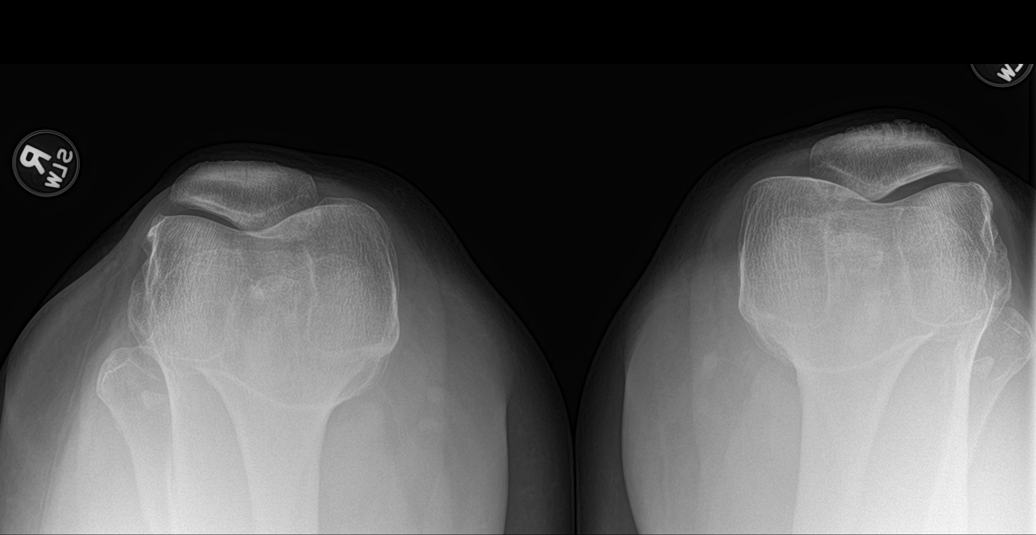

[knee ap bilat standing (2 of 2)]
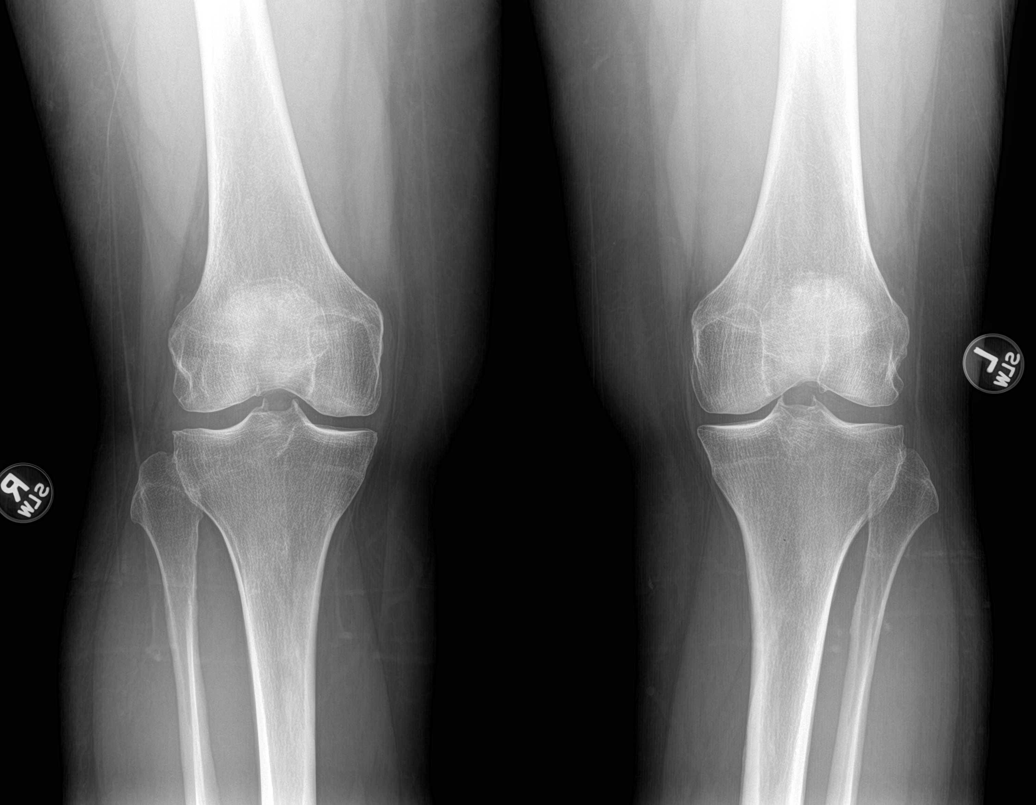

[knee lat (2 of 2)]
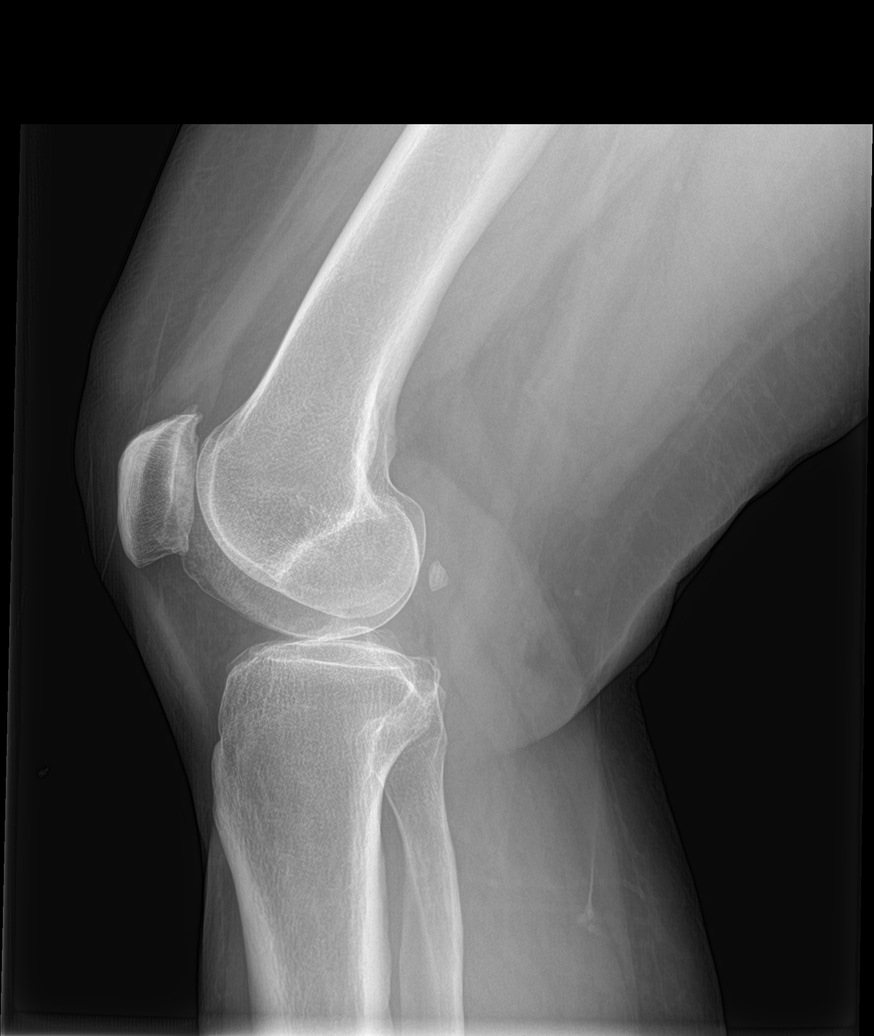

[5 of 5 positions shown; findings below may reference images not displayed]

FINDINGS: Diffuse osteopenia and degenerative change. Degenerative changes are
tricompartment. The degenerative changes are most prominent about
the patellofemoral compartment. No evidence of fracture or
dislocation. No significant effusion .
IMPRESSION: Diffuse osteopenia tricompartment degenerative change. No acute
abnormality.

## 2016-12-12 ENCOUNTER — Encounter: Payer: Self-pay | Admitting: Family Medicine

## 2017-06-18 DIAGNOSIS — T7840XA Allergy, unspecified, initial encounter: Secondary | ICD-10-CM | POA: Diagnosis not present

## 2017-06-18 DIAGNOSIS — T63441A Toxic effect of venom of bees, accidental (unintentional), initial encounter: Secondary | ICD-10-CM | POA: Diagnosis not present

## 2017-08-02 ENCOUNTER — Other Ambulatory Visit (HOSPITAL_BASED_OUTPATIENT_CLINIC_OR_DEPARTMENT_OTHER): Payer: Self-pay | Admitting: Physician Assistant

## 2017-08-02 DIAGNOSIS — M25562 Pain in left knee: Secondary | ICD-10-CM

## 2017-09-27 DIAGNOSIS — M1712 Unilateral primary osteoarthritis, left knee: Secondary | ICD-10-CM | POA: Diagnosis not present

## 2017-11-28 ENCOUNTER — Encounter: Payer: Self-pay | Admitting: Sports Medicine

## 2017-11-28 ENCOUNTER — Ambulatory Visit (INDEPENDENT_AMBULATORY_CARE_PROVIDER_SITE_OTHER): Payer: 59

## 2017-11-28 ENCOUNTER — Ambulatory Visit: Payer: 59 | Admitting: Sports Medicine

## 2017-11-28 DIAGNOSIS — M7989 Other specified soft tissue disorders: Secondary | ICD-10-CM | POA: Diagnosis not present

## 2017-11-28 DIAGNOSIS — G8929 Other chronic pain: Secondary | ICD-10-CM

## 2017-11-28 DIAGNOSIS — M25571 Pain in right ankle and joints of right foot: Secondary | ICD-10-CM

## 2017-11-28 DIAGNOSIS — M25562 Pain in left knee: Secondary | ICD-10-CM | POA: Diagnosis not present

## 2017-11-28 DIAGNOSIS — S99911A Unspecified injury of right ankle, initial encounter: Secondary | ICD-10-CM

## 2017-11-28 DIAGNOSIS — M25471 Effusion, right ankle: Secondary | ICD-10-CM | POA: Insufficient documentation

## 2017-11-28 DIAGNOSIS — M25462 Effusion, left knee: Secondary | ICD-10-CM | POA: Diagnosis not present

## 2017-11-28 NOTE — Assessment & Plan Note (Signed)
Partial meniscectomy and lateral release years ago. Now with an effusion, as well as what sounds to be an intra-articular loose body. She does get locking, catching and other mechanical symptoms. X-ray showed a suspicion for a loose body, adding an MRI. Return for MRI results, we will probably do an aspiration and injection at that time. She did have Visco supplementation at an outside facility.

## 2017-11-28 NOTE — Assessment & Plan Note (Signed)
No fractures, this is a lateral sprain, ASO for 2-3 weeks. Ice for 20 minutes 3-4 times a day. Crutches when weightbearing for the next week. Rehab exercises every day. May use ibuprofen 800 mg up to 3 times per day. Return in 1 month. 

## 2017-11-28 NOTE — Progress Notes (Signed)
   Subjective:    I'm seeing this patient as a consultation for:  Dr. Nani Gasseratherine Metheney  CC: Right ankle pain  HPI: Patient is a 51 y/o female who presents for right ankle pain. She reports falling down the steps this morning and landed on an inverted ankle. She reports sudden onset of swelling and pain and was unable to bear weight initially. She rates the pain as a 4/10 and localized to the ankle, she denies radiation of pain, numbness, or tingling.  Patient also reports pain and swelling of her left knee, locking, and catching sensations. She has a history of a partial menisectomy and lateral release.  Past medical history, Surgical history, Family history not pertinant except as noted below, Social history, Allergies, and medications have been entered into the medical record, reviewed, and no changes needed.   (To billers/coders, pertinent past medical, social, surgical, family history can be found in problem list, if problem list is marked as reviewed then this indicates that past medical, social, surgical, family history was also reviewed)  Review of Systems: No headache, visual changes, nausea, vomiting, diarrhea, constipation, dizziness, abdominal pain, skin rash, fevers, chills, night sweats, weight loss, swollen lymph nodes, body aches, muscle aches, chest pain, shortness of breath, mood changes, visual or auditory hallucinations.   Objective:   General: Well Developed, well nourished, and in no acute distress.  Neuro:  Extra-ocular muscles intact, able to move all 4 extremities, sensation grossly intact.  Deep tendon reflexes tested were normal. Psych: Alert and oriented, mood congruent with affect. ENT:  Ears and nose appear unremarkable.  Hearing grossly normal. Neck: Unremarkable overall appearance, trachea midline.  No visible thyroid enlargement. Eyes: Conjunctivae and lids appear unremarkable.  Pupils equal and round. Skin: Warm and dry, no rashes noted.  Cardiovascular:  Pulses palpable, no extremity edema. Right Ankle: Visible swelling over the right lateral malleolus, no erythema or ecchymosis present. Range of motion is full in all directions, but illicit's pain with inversion, plantar flexion and dorsiflexion. Strength is 5/5 in all directions. Stable lateral and medial ligaments; squeeze test and kleiger test unremarkable; Talar dome nontender; No pain at base of 5th MT; No tenderness over cuboid; No tenderness over N spot or navicular prominence Tenderness on posterior and anterior aspects of lateral malleolus, no pain over the medial malleolus. No sign of peroneal tendon subluxations or tenderness to palpation Negative tarsal tunnel tinel's Able to walk 4 steps, but antalgic gait.  Impression and Recommendations:   This case required medical decision making of moderate complexity.  Right ankle injury No fractures, this is a lateral sprain, ASO for 2-3 weeks. Ice for 20 minutes 3-4 times a day. Crutches when weightbearing for the next week. Rehab exercises every day. May use ibuprofen 800 mg up to 3 times per day. Return in 1 month.  Pain and swelling of left knee Partial meniscectomy and lateral release years ago. Now with an effusion, as well as what sounds to be an intra-articular loose body. She does get locking, catching and other mechanical symptoms. X-ray showed a suspicion for a loose body, adding an MRI. Return for MRI results, we will probably do an aspiration and injection at that time. She did have Visco supplementation at an outside facility.   ___________________________________________ Ihor Austinhomas J. Benjamin Stainhekkekandam, M.D., ABFM., CAQSM. Primary Care and Sports Medicine [facility]  Adjunct Instructor of Family Medicine  [employer]

## 2017-11-28 NOTE — Patient Instructions (Signed)
No fractures, this is a lateral sprain, ASO for 2-3 weeks. Ice for 20 minutes 3-4 times a day. Crutches when weightbearing for the next week. Rehab exercises every day. May use ibuprofen 800 mg up to 3 times per day. Return in 1 month.

## 2017-12-23 DIAGNOSIS — Z1231 Encounter for screening mammogram for malignant neoplasm of breast: Secondary | ICD-10-CM | POA: Diagnosis not present

## 2017-12-23 LAB — HM MAMMOGRAPHY

## 2017-12-30 ENCOUNTER — Encounter: Payer: Self-pay | Admitting: Sports Medicine

## 2017-12-30 ENCOUNTER — Encounter: Payer: Self-pay | Admitting: Family Medicine

## 2017-12-30 ENCOUNTER — Ambulatory Visit (INDEPENDENT_AMBULATORY_CARE_PROVIDER_SITE_OTHER): Payer: 59

## 2017-12-30 ENCOUNTER — Ambulatory Visit: Payer: 59 | Admitting: Sports Medicine

## 2017-12-30 DIAGNOSIS — G8929 Other chronic pain: Secondary | ICD-10-CM

## 2017-12-30 DIAGNOSIS — M25462 Effusion, left knee: Secondary | ICD-10-CM | POA: Diagnosis not present

## 2017-12-30 DIAGNOSIS — S99911D Unspecified injury of right ankle, subsequent encounter: Secondary | ICD-10-CM

## 2017-12-30 DIAGNOSIS — M25562 Pain in left knee: Secondary | ICD-10-CM

## 2017-12-30 DIAGNOSIS — M23242 Derangement of anterior horn of lateral meniscus due to old tear or injury, left knee: Secondary | ICD-10-CM | POA: Diagnosis not present

## 2017-12-30 DIAGNOSIS — M1712 Unilateral primary osteoarthritis, left knee: Secondary | ICD-10-CM | POA: Diagnosis not present

## 2017-12-30 DIAGNOSIS — S83242A Other tear of medial meniscus, current injury, left knee, initial encounter: Secondary | ICD-10-CM | POA: Diagnosis not present

## 2017-12-30 NOTE — Assessment & Plan Note (Signed)
Persistent pain after a month, ankle infusion. Injection as above, return in 1 month. She will continue her ASO. Continue rehab exercises.

## 2017-12-30 NOTE — Assessment & Plan Note (Addendum)
MRI shows expected osteoarthritis with degenerative meniscal tears and a small intra-articular loose body. Injection as above, if insufficient relief we will have to proceed with referral for knee arthroplasty. Knee exercises given and she will find a knee sleeve.

## 2017-12-30 NOTE — Progress Notes (Signed)
Subjective:    CC: Follow-up multiple issues  HPI: Right ankle injury: It has been almost a month now, she has been in an ASO, doing NSAIDs, rehab.  Unfortunately still has pain and swelling mostly lateral but over the entire talar dome.  Left knee pain: Known osteoarthritis, MRI results will be dictated below, pain is moderate, persistent, few mechanical symptoms.  NSAIDs are ineffective.  I reviewed the past medical history, family history, social history, surgical history, and allergies today and no changes were needed.  Please see the problem list section below in epic for further details.  Past Medical History: No past medical history on file. Past Surgical History: Past Surgical History:  Procedure Laterality Date  . KNEE ARTHROSCOPY Left 06/13/15   Left knee arthroscopy partial medial meniscectomy with sub-chondroplasty of medial tibial plateau and lateral retinacular release   Social History: Social History   Socioeconomic History  . Marital status: Married    Spouse name: Giliana Vantil  . Number of children: 5  . Years of education: None  . Highest education level: None  Social Needs  . Financial resource strain: None  . Food insecurity - worry: None  . Food insecurity - inability: None  . Transportation needs - medical: None  . Transportation needs - non-medical: None  Occupational History  . Occupation: customer service    Comment: Geologist, engineering  Tobacco Use  . Smoking status: Never Smoker  . Smokeless tobacco: Never Used  Substance and Sexual Activity  . Alcohol use: Yes    Alcohol/week: 3.6 - 4.2 oz    Types: 6 - 7 Glasses of wine per week    Comment: 6-7/week  . Drug use: No  . Sexual activity: Yes    Partners: Male    Birth control/protection: None  Other Topics Concern  . None  Social History Narrative   1-2 cups of caffeine per day, she works for [employer] as a Museum/gallery conservator. She does not exercise regularly. She  completed Technical school.    Family History: Family History  Problem Relation Age of Onset  . Heart attack Father   . Hyperlipidemia Father   . Hypertension Father    Allergies: No Known Allergies Medications: See med rec.  Review of Systems: No fevers, chills, night sweats, weight loss, chest pain, or shortness of breath.   Objective:    General: Well Developed, well nourished, and in no acute distress.  Neuro: Alert and oriented x3, extra-ocular muscles intact, sensation grossly intact.  HEENT: Normocephalic, atraumatic, pupils equal round reactive to light, neck supple, no masses, no lymphadenopathy, thyroid nonpalpable.  Skin: Warm and dry, no rashes. Cardiac: Regular rate and rhythm, no murmurs rubs or gallops, no lower extremity edema.  Respiratory: Clear to auscultation bilaterally. Not using accessory muscles, speaking in full sentences. Left knee: Minimal swelling with tenderness at the medial joint line ROM normal in flexion and extension and lower leg rotation. Ligaments with solid consistent endpoints including ACL, PCL, LCL, MCL. Negative Mcmurray's and provocative meniscal tests. Non painful patellar compression. Patellar and quadriceps tendons unremarkable. Hamstring and quadriceps strength is normal. Right ankle: Moderate swelling, palpable effusion Range of motion is full in all directions. Strength is 5/5 in all directions. Stable lateral and medial ligaments; squeeze test and kleiger test unremarkable; Talar dome nontender; No pain at base of 5th MT; No tenderness over cuboid; No tenderness over N spot or navicular prominence No tenderness on posterior aspects of lateral and medial malleolus  No sign of peroneal tendon subluxations; Negative tarsal tunnel tinel's Able to walk 4 steps.  Procedure: Real-time Ultrasound Guided Injection of left knee Device: GE Logiq E  Verbal informed consent obtained.  Time-out conducted.  Noted no overlying erythema,  induration, or other signs of local infection.  Skin prepped in a sterile fashion.  Local anesthesia: Topical Ethyl chloride.  With sterile technique and under real time ultrasound guidance: 1 cc kenalog 40, 2 cc lidocaine, 2 cc bupivacaine injected easily into the supra patellar recess. Completed without difficulty  Pain immediately resolved suggesting accurate placement of the medication.  Advised to call if fevers/chills, erythema, induration, drainage, or persistent bleeding.  Images permanently stored and available for review in the ultrasound unit.  Impression: Technically successful ultrasound guided injection.  Procedure: Real-time Ultrasound Guided Injection of right ankle Device: GE Logiq E  Verbal informed consent obtained.  Time-out conducted.  Noted no overlying erythema, induration, or other signs of local infection.  Skin prepped in a sterile fashion.  Local anesthesia: Topical Ethyl chloride.  With sterile technique and under real time ultrasound guidance: Noted ankle joint effusion, using a 25-gauge needle advanced into the tibiotalar joint and injected 1 cc kenalog 40, 1 cc lidocaine, 1 cc bupivacaine. Completed without difficulty  Pain immediately resolved suggesting accurate placement of the medication.  Advised to call if fevers/chills, erythema, induration, drainage, or persistent bleeding.  Images permanently stored and available for review in the ultrasound unit.  Impression: Technically successful ultrasound guided injection.  Impression and Recommendations:    Primary osteoarthritis of left knee MRI shows expected osteoarthritis with degenerative meniscal tears and a small intra-articular loose body. Injection as above, if insufficient relief we will have to proceed with referral for knee arthroplasty. Knee exercises given and she will find a knee sleeve.  Right ankle injury Persistent pain after a month, ankle infusion. Injection as above, return in 1  month. She will continue her ASO. Continue rehab exercises. ___________________________________________ Ihor Austinhomas J. Benjamin Stainhekkekandam, M.D., ABFM., CAQSM. Primary Care and Sports Medicine [facility]  Adjunct Instructor of Family Medicine  [employer]

## 2018-01-03 ENCOUNTER — Encounter: Payer: Self-pay | Admitting: Sports Medicine

## 2018-01-28 ENCOUNTER — Ambulatory Visit: Payer: 59 | Admitting: Sports Medicine

## 2018-01-28 ENCOUNTER — Encounter: Payer: Self-pay | Admitting: Sports Medicine

## 2018-01-28 DIAGNOSIS — S99911D Unspecified injury of right ankle, subsequent encounter: Secondary | ICD-10-CM | POA: Diagnosis not present

## 2018-01-28 DIAGNOSIS — M1712 Unilateral primary osteoarthritis, left knee: Secondary | ICD-10-CM | POA: Diagnosis not present

## 2018-01-28 MED ORDER — DICLOFENAC SODIUM 2 % TD SOLN: 2.0000 | Freq: Two times a day (BID) | TRANSDERMAL | 11 refills | Status: AC

## 2018-01-28 NOTE — Progress Notes (Signed)
Subjective:    CC: Follow-up  HPI: Left knee osteoarthritis: Nearly pain-free after injection.  Still has a bit of discomfort.  Also needs to lose some weight.  Right ankle pain: Mild to moderate osteoarthritis, spurring on x-ray, nearly completely resolved after ankle joint injection, continues to have some minimal pain over the medial ankle behind the medial malleolus.  I reviewed the past medical history, family history, social history, surgical history, and allergies today and no changes were needed.  Please see the problem list section below in epic for further details.  Past Medical History: No past medical history on file. Past Surgical History: Past Surgical History:  Procedure Laterality Date  . KNEE ARTHROSCOPY Left 06/13/15   Left knee arthroscopy partial medial meniscectomy with sub-chondroplasty of medial tibial plateau and lateral retinacular release   Social History: Social History   Socioeconomic History  . Marital status: Married    Spouse name: Stashia Sia  . Number of children: 5  . Years of education: None  . Highest education level: None  Social Needs  . Financial resource strain: None  . Food insecurity - worry: None  . Food insecurity - inability: None  . Transportation needs - medical: None  . Transportation needs - non-medical: None  Occupational History  . Occupation: customer service    Comment: Geologist, engineering  Tobacco Use  . Smoking status: Never Smoker  . Smokeless tobacco: Never Used  Substance and Sexual Activity  . Alcohol use: Yes    Alcohol/week: 3.6 - 4.2 oz    Types: 6 - 7 Glasses of wine per week    Comment: 6-7/week  . Drug use: No  . Sexual activity: Yes    Partners: Male    Birth control/protection: None  Other Topics Concern  . None  Social History Narrative   1-2 cups of caffeine per day, she works for [employer] as a Museum/gallery conservator. She does not exercise regularly. She completed Technical  school.    Family History: Family History  Problem Relation Age of Onset  . Heart attack Father   . Hyperlipidemia Father   . Hypertension Father    Allergies: No Known Allergies Medications: See med rec.  Review of Systems: No fevers, chills, night sweats, weight loss, chest pain, or shortness of breath.   Objective:    General: Well Developed, well nourished, and in no acute distress.  Neuro: Alert and oriented x3, extra-ocular muscles intact, sensation grossly intact.  HEENT: Normocephalic, atraumatic, pupils equal round reactive to light, neck supple, no masses, no lymphadenopathy, thyroid nonpalpable.  Skin: Warm and dry, no rashes. Cardiac: Regular rate and rhythm, no murmurs rubs or gallops, no lower extremity edema.  Respiratory: Clear to auscultation bilaterally. Not using accessory muscles, speaking in full sentences. Right ankle: No visible erythema or swelling. Range of motion is full in all directions. Strength is 5/5 in all directions. Stable lateral and medial ligaments; squeeze test and kleiger test unremarkable; Talar dome nontender; No pain at base of 5th MT; No tenderness over cuboid; No tenderness over N spot or navicular prominence Tender to palpation by the medial malleolus, reproduction of pain with resisted inversion of the foot consistent with tibialis posterior tendon is a pain generator. No sign of peroneal tendon subluxations; Negative tarsal tunnel tinel's Able to walk 4 steps.  Impression and Recommendations:    Primary osteoarthritis of left knee Expected osteoarthritis and degenerative meniscal tears, as well as a small intra-articular loose body. Did  well with the injection. Adding topical Pennsaid (diclofenac 2% topical). She will also work with her PCP on weight loss. Rehab exercises given.  Right ankle injury Swelling and pain is for the most part gone after injection into the joint. 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 Rehab exercises given, return in 1 month. Again, she will work on weight loss aggressively with her PCP.  I spent 40 minutes with this patient, greater than 50% was face-to-face time counseling regarding the above diagnoses ___________________________________________ Ihor Austinhomas J. Benjamin Stainhekkekandam, M.D., ABFM., CAQSM. Primary Care and Sports Medicine [facility]  Adjunct Instructor of Family Medicine  [employer]

## 2018-01-28 NOTE — Patient Instructions (Signed)
Make follow-up with me in 4 weeks to recheck ankle and knee, follow-up with Dr. Linford ArnoldMetheney to start weight loss treatment.

## 2018-01-28 NOTE — Assessment & Plan Note (Signed)
Expected osteoarthritis and degenerative meniscal tears, as well as a small intra-articular loose body. Did well with the injection. Adding topical Pennsaid (diclofenac 2% topical). She will also work with her PCP on weight loss. Rehab exercises given.

## 2018-01-28 NOTE — Assessment & Plan Note (Signed)
Swelling and pain is for the most part gone after injection into the joint. She still has some tibialis posterior tendinopathy. Rehab exercises given, return in 1 month. Again, she will work on weight loss aggressively with her PCP.

## 2018-03-25 ENCOUNTER — Encounter: Payer: 59 | Admitting: Family Medicine

## 2018-07-03 ENCOUNTER — Ambulatory Visit (INDEPENDENT_AMBULATORY_CARE_PROVIDER_SITE_OTHER): Payer: 59 | Admitting: Family Medicine

## 2018-07-03 ENCOUNTER — Encounter: Payer: Self-pay | Admitting: Family Medicine

## 2018-07-03 VITALS — BP 125/74 | HR 76 | Temp 98.1°F | Wt 177.8 lb

## 2018-07-03 DIAGNOSIS — H1032 Unspecified acute conjunctivitis, left eye: Secondary | ICD-10-CM | POA: Diagnosis not present

## 2018-07-03 DIAGNOSIS — M25471 Effusion, right ankle: Secondary | ICD-10-CM | POA: Diagnosis not present

## 2018-07-03 MED ORDER — POLYMYXIN B-TRIMETHOPRIM 10000-0.1 UNIT/ML-% OP SOLN: 1.0000 [drp] | Freq: Four times a day (QID) | OPHTHALMIC | 0 refills | Status: DC

## 2018-07-03 NOTE — Patient Instructions (Signed)
Thank you for coming in today. Use the antibiotic eye drops.  Use over-the-counter Zaditor eyedrops (Ketotifen)  Recheck as needed.    Allergic Conjunctivitis, Adult Allergic conjunctivitis is inflammation of the clear membrane that covers the white part of your eye and the inner surface of your eyelid (conjunctiva). The inflammation is caused by allergies. The blood vessels in the conjunctiva become inflamed and this causes the eyes to become red or pink. The eyes often feel itchy. Allergic conjunctivitis cannot be spread from one person to another person (is not contagious). What are the causes? This condition is caused by an allergic reaction. Common causes of an allergic reaction (allergens) include:  Outdoor allergens, such as: ? Pollen. ? Grass and weeds. ? Mold spores.  Indoor allergens, such as: ? Dust. ? Smoke. ? Mold. ? Pet dander. ? Animal hair.  What increases the risk? You may be more likely to develop this condition if you have a family history of allergies, such as:  Allergic rhinitis.  Bronchial asthma.  Atopic dermatitis.  What are the signs or symptoms? Symptoms of this condition include eyes that are:  Itchy.  Red.  Watery.  Puffy.  Your eyes may also:  Sting or burn.  Have clear drainage coming from them.  How is this diagnosed? This condition may be diagnosed by medical history and physical exam. If you have drainage from your eyes, it may be tested to rule out other causes of conjunctivitis. You may also need to see a health care provider who specializes in treating allergies (allergist) or eye conditions (ophthalmologist) for tests to confirm the diagnosis. You may have:  Skin tests to see which allergens are causing your symptoms. These tests involve pricking the skin with a tiny needle and exposing the skin to small amounts of potential allergens to see if your skin reacts.  Blood tests.  Tissue scrapings from your eyelid. These will  be examined under a microscope.  How is this treated? Treatments for this condition may include:  Cold cloths (compresses) to soothe itching and swelling.  Washing the face to remove allergens.  Eye drops. These may be prescription or over-the-counter. There are several different types. You may need to try different types to see which one works best for you. Your may need: ? Eye drops that block the allergic reaction (antihistamine). ? Eye drops that reduce swelling and irritation (anti-inflammatory). ? Steroid eye drops to lessen a severe reaction (vernal conjunctivitis).  Oral antihistamine medicines to reduce your allergic reaction. You may need these if eye drops do not help or are difficult to use.  Follow these instructions at home:  Avoid known allergens whenever possible.  Take or apply over-the-counter and prescription medicines only as told by your health care provider. These include any eye drops.  Apply a cool, clean washcloth to your eye for 10-20 minutes, 3-4 times a day.  Do not touch or rub your eyes.  Do not wear contact lenses until the inflammation is gone. Wear glasses instead.  Do not wear eye makeup until the inflammation is gone.  Keep all follow-up visits as told by your health care provider. This is important. Contact a health care provider if:  Your symptoms get worse or do not improve with treatment.  You have mild eye pain.  You have sensitivity to light.  You have spots or blisters on your eyes.  You have pus draining from your eye.  You have a fever. Get help right away if:  You have redness, swelling, or other symptoms in only one eye.  Your vision is blurred or you have vision changes.  You have severe eye pain. This information is not intended to replace advice given to you by your health care provider. Make sure you discuss any questions you have with your health care provider. Document Released: 02/23/2003 Document Revised:  08/01/2016 Document Reviewed: 06/15/2016 Elsevier Interactive Patient Education  2018 [employer].

## 2018-07-04 ENCOUNTER — Encounter: Payer: Self-pay | Admitting: Family Medicine

## 2018-07-04 DIAGNOSIS — B308 Other viral conjunctivitis: Secondary | ICD-10-CM | POA: Diagnosis not present

## 2018-07-04 NOTE — Progress Notes (Signed)
Heidi Fuller is a 52 y.o. female [facility] today for left eye irritation.  Heidi Fuller notes conjunctival injection and irritation itching and watery eyes in her left eye starting yesterday.  She denies any exposure or injury.  She denies any blurry vision or pain.  She denies significant photophobia.  She wears contacts and removed her contacts yesterday has not really use them.  She notes that her son had impetigo and she recently touch the scalp and may have touched her face before she washed her hands but is not sure.  She denies fevers or chills.  She takes Careers adviserAllegra for seasonal allergies.  She has not had specific treatment for her eye yet.  She has been using some warm compress which has not helped.  Additionally Heidi Fuller notes continued right ankle swelling.  She was seen by Dr. Karie Schwalbe in clinic 6 months ago for an ankle sprain.  She is been doing home exercises but notes continued mild swelling.  She notes is mildly painful as well.  She notes that the ankle swelling does cause some limitation in her activity but not significantly.  She denies locking or catching.  ROS as above:  Exam:  BP 125/74 (BP Location: Right Arm, Patient Position: Sitting, Cuff Size: Normal)   Pulse 76   Temp 98.1 F (36.7 C) (Oral)   Wt 177 lb 12.8 oz (80.6 kg)   BMI 29.59 kg/m  Gen: Well NAD HEENT: EOMI,  MMM moderate left-sided conjunctival injection.  Normal funduscopic exam left eye.  Normal eye motion bilaterally. Lungs: Normal work of breathing. CTABL Heart: RRR no MRG Exts: Brisk capillary refill, warm and well perfused.  Right ankle slightly swollen at ATFL area.  Mildly tender this each region.  Normal ankle motion.  Stable ligaments exam.  Normal gait.  Xray images: EXAM: RIGHT ANKLE - COMPLETE 3+ VIEW  COMPARISON:  03/17/2015  FINDINGS: Lateral soft tissue  swelling. No fracture, subluxation or dislocation. Joint space is maintained.  IMPRESSION: Lateral soft tissue swelling.  No acute bony abnormality.   Electronically Signed   By: Charlett NoseKevin  Dover M.D.   On: 11/28/2017 11:08 I personally (independently) visualized and performed the interpretation of the images attached in this note.   Assessment and Plan: 52 y.o. female with conjunctivitis allergic versus possible early bacterial with exposure to impetigo.  Plan for Zaditor eyedrops, and Polytrim antibiotic eyedrops.  If not improving next step would be follow-up with the neurology.  Recheck with PCP in the future as needed.  Ankle swelling: Following injury.  Symptoms are mild at this point.  We discussed that there are further treatment options including injection formal physical therapy and compression sleeves.  Recommend body helix full ankle compression sleeve.  Patient is not sure that she wants to go with her for further work-up at this time.  Proceed with further home exercise program and use ankle sleeve.  If not better next step would be consider injection versus further evaluation including MRI.  No orders of the defined types were placed in this encounter.  Meds ordered this encounter  Medications  . DISCONTD: trimethoprim-polymyxin b (POLYTRIM) ophthalmic solution    Sig: Place 1 drop into the left eye every 6 (six) hours.    Dispense:  10 mL    Refill:  0  . trimethoprim-polymyxin b (POLYTRIM) ophthalmic solution    Sig: Place 1 drop into the left eye every 6 (six)  hours.    Dispense:  10 mL    Refill:  0     Historical information moved to improve visibility of documentation.  Past Medical History:  Diagnosis Date  . AR (allergic rhinitis) 04/25/2015  . Osteopenia determined by x-ray 03/17/2015  . Vitamin D deficiency 04/25/2015   Past Surgical History:  Procedure Laterality Date  . KNEE ARTHROSCOPY Left 06/13/15   Left knee arthroscopy partial medial meniscectomy  with sub-chondroplasty of medial tibial plateau and lateral retinacular release   Social History   Tobacco Use  . Smoking status: Never Smoker  . Smokeless tobacco: Never Used  Substance Use Topics  . Alcohol use: Yes    Alcohol/week: 3.6 - 4.2 oz    Types: 6 - 7 Glasses of wine per week    Comment: 6-7/week   family history includes Heart attack in her father; Hyperlipidemia in her father; Hypertension in her father.  Medications: Current Outpatient Medications  Medication Sig Dispense Refill  . Ascorbic Acid (VITAMIN C) 1000 MG tablet Take 1,000 mg by mouth daily.    . Diclofenac Sodium 2 % SOLN Place 2 sprays onto the skin 2 (two) times daily. 1 Bottle 11  . fexofenadine (ALLEGRA) 30 MG tablet Take 30 mg by mouth 2 (two) times daily as needed.    . Multiple Vitamin (MULTIVITAMIN) capsule Take 1 capsule by mouth daily.    . Vitamin D-Vitamin K (VITAMIN K2-VITAMIN D3 PO) Take 4,000 Int'l Units by mouth daily.    Marland Kitchen trimethoprim-polymyxin b (POLYTRIM) ophthalmic solution Place 1 drop into the left eye every 6 (six) hours. 10 mL 0   No current facility-administered medications for this visit.    No Known Allergies   Discussed warning signs or symptoms. Please see discharge instructions. Patient expresses understanding.

## 2018-07-08 DIAGNOSIS — B308 Other viral conjunctivitis: Secondary | ICD-10-CM | POA: Diagnosis not present

## 2018-10-15 ENCOUNTER — Ambulatory Visit (INDEPENDENT_AMBULATORY_CARE_PROVIDER_SITE_OTHER): Payer: 59 | Admitting: Physician Assistant

## 2018-10-15 DIAGNOSIS — Z23 Encounter for immunization: Secondary | ICD-10-CM

## 2018-10-16 ENCOUNTER — Telehealth: Payer: Self-pay

## 2018-10-16 NOTE — Telephone Encounter (Signed)
Med list updated per pt request.

## 2019-08-06 IMAGING — DX DG ANKLE COMPLETE 3+V*R*
3 series · 3 of 3 positions shown · non-contrast
Comparison: 03/17/2015

CLINICAL DATA: Right ankle pain, swelling, inversion injury

EXAM:
RIGHT ANKLE - COMPLETE 3+ VIEW

[ankle ap]
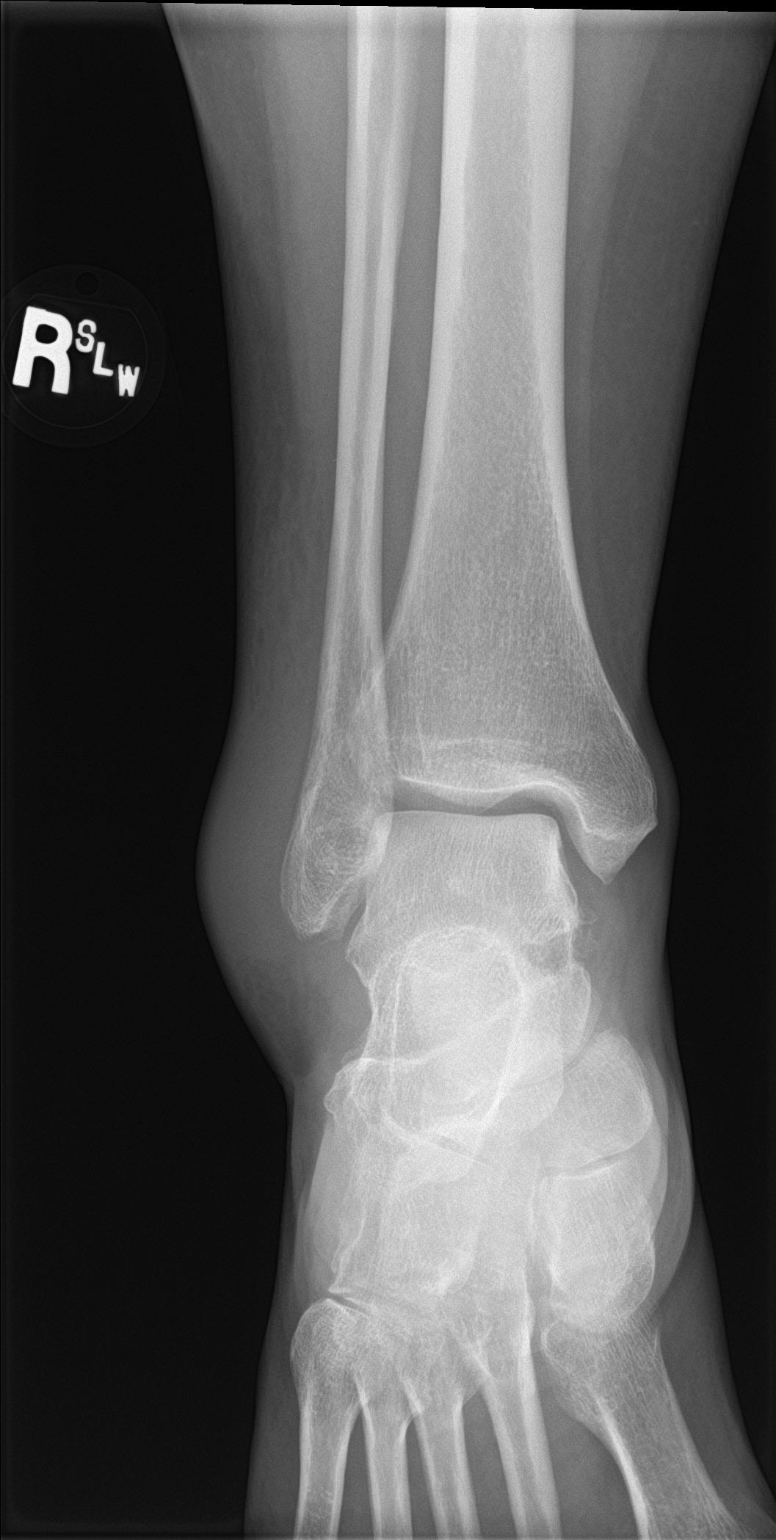

[ankle obl]
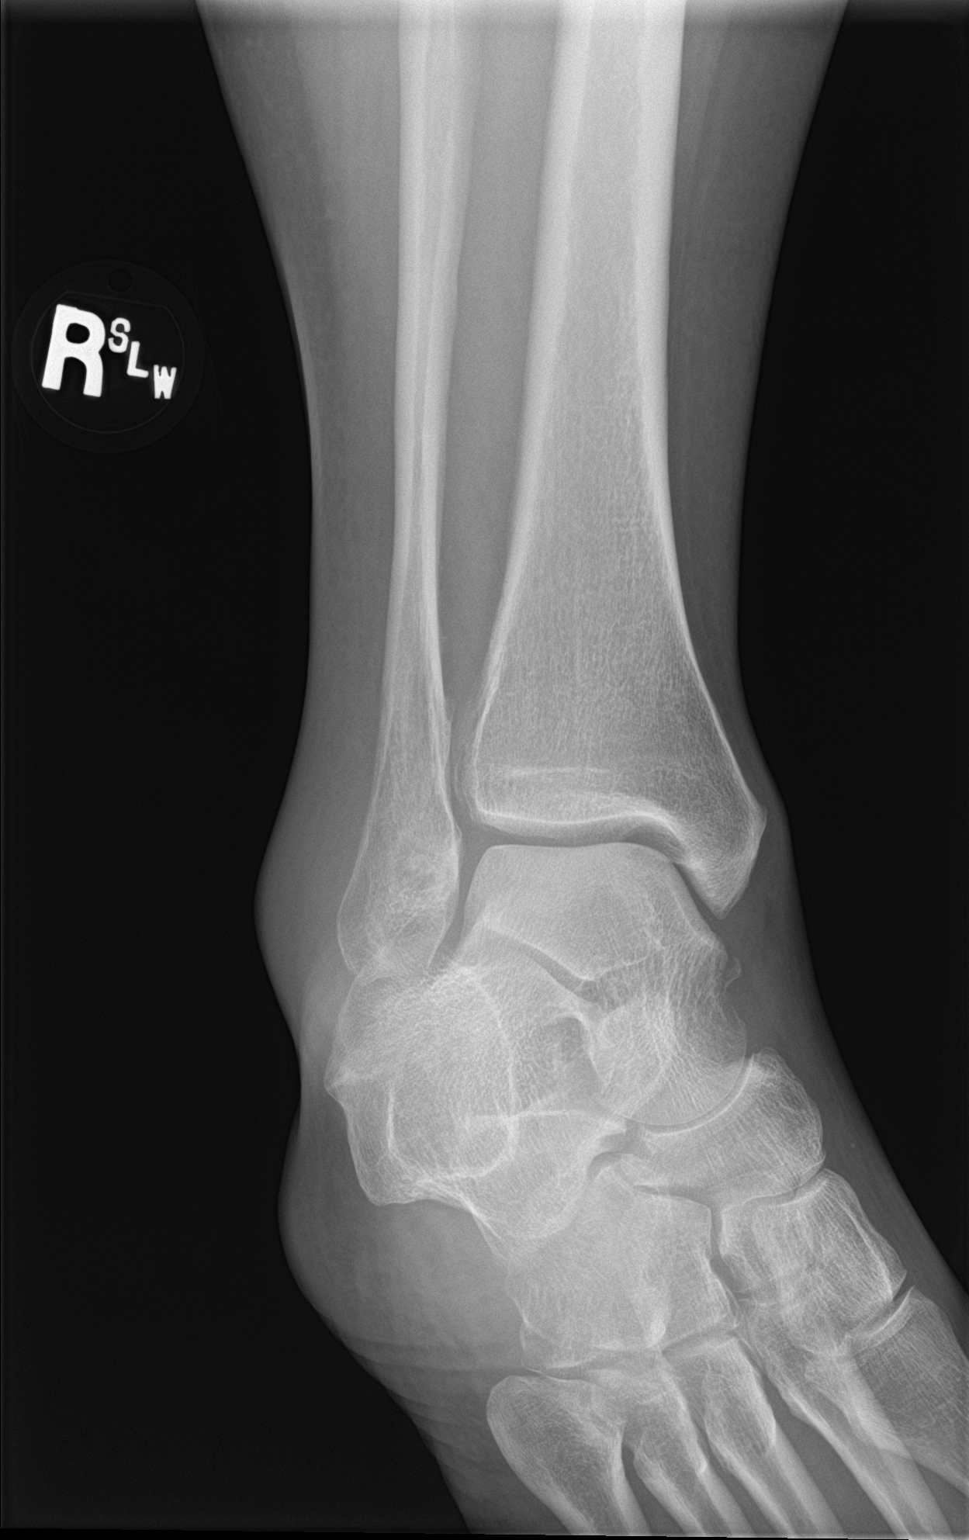

[ankle lat]
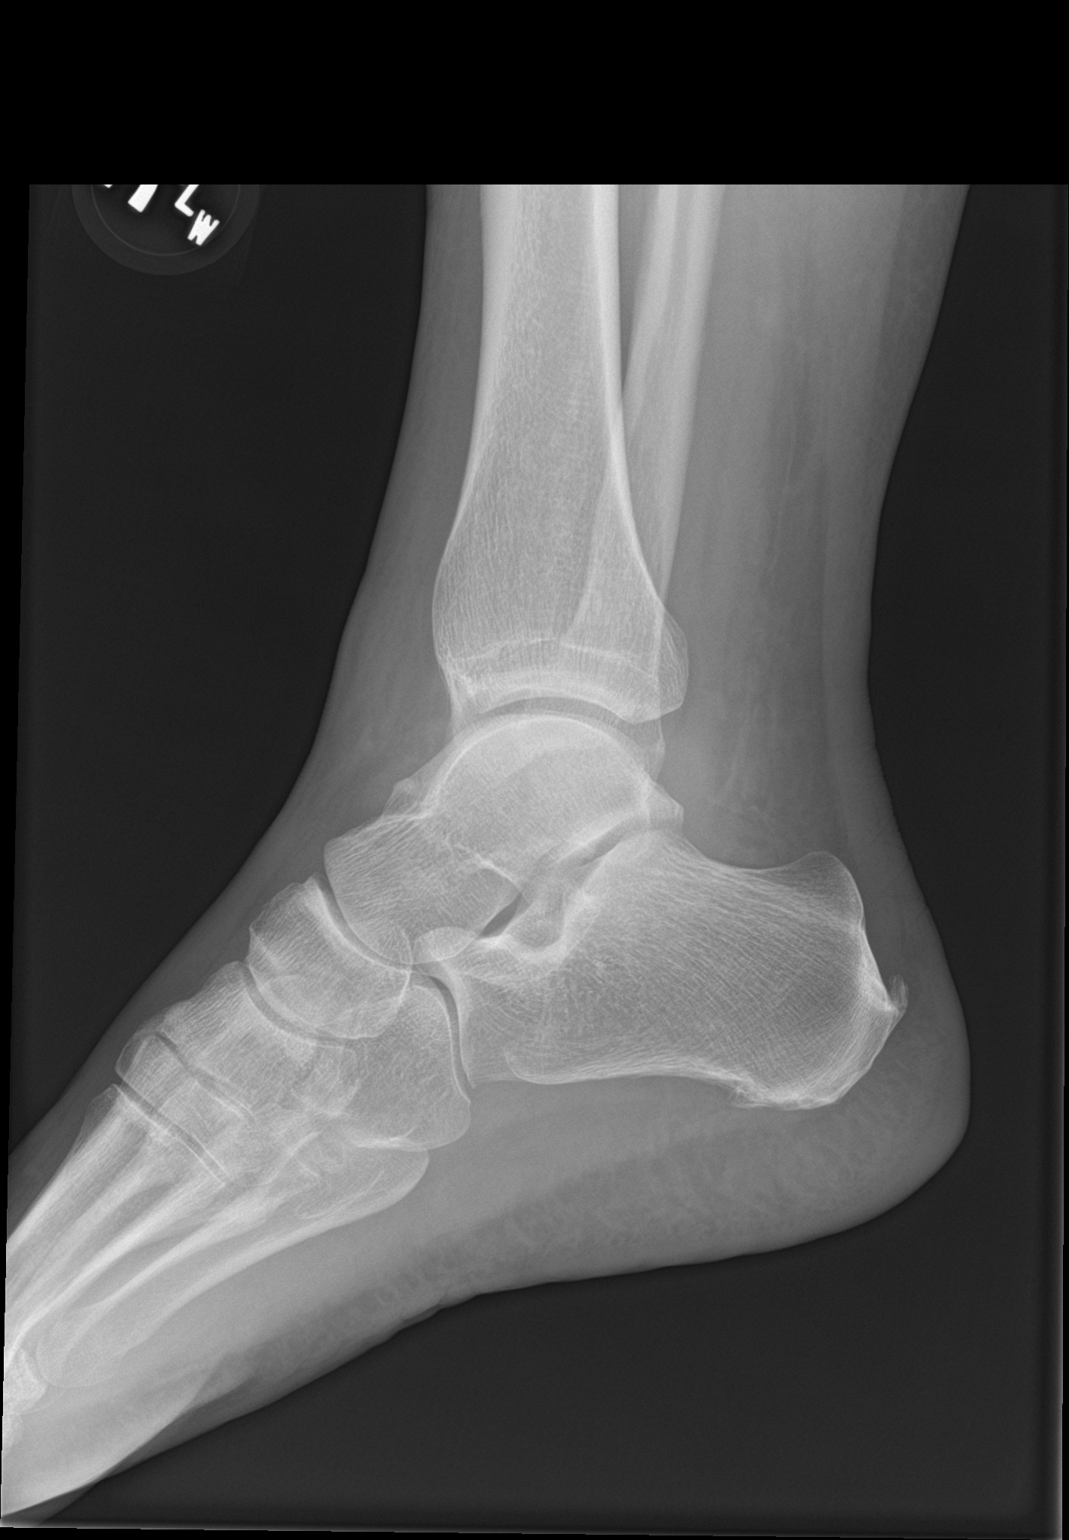

[3 of 3 positions shown; findings below may reference images not displayed]

FINDINGS: Lateral soft tissue swelling. No fracture, subluxation or
dislocation. Joint space is maintained.
IMPRESSION: Lateral soft tissue swelling.  No acute bony abnormality.
